# Patient Record
Sex: Female | Born: 1945 | Race: White | Hispanic: No | Smoking: Current every day smoker
Health system: Southern US, Community
[De-identification: ages and names within clinical notes are randomized; demographics above are authoritative.]

## PROBLEM LIST (undated history)

## (undated) DIAGNOSIS — F419 Anxiety disorder, unspecified: Secondary | ICD-10-CM

## (undated) DIAGNOSIS — F32A Depression, unspecified: Secondary | ICD-10-CM

## (undated) DIAGNOSIS — Z9889 Other specified postprocedural states: Secondary | ICD-10-CM

## (undated) DIAGNOSIS — F1010 Alcohol abuse, uncomplicated: Secondary | ICD-10-CM

## (undated) DIAGNOSIS — C801 Malignant (primary) neoplasm, unspecified: Secondary | ICD-10-CM

## (undated) DIAGNOSIS — J189 Pneumonia, unspecified organism: Secondary | ICD-10-CM

## (undated) DIAGNOSIS — F329 Major depressive disorder, single episode, unspecified: Secondary | ICD-10-CM

## (undated) DIAGNOSIS — R112 Nausea with vomiting, unspecified: Secondary | ICD-10-CM

## (undated) DIAGNOSIS — T7840XA Allergy, unspecified, initial encounter: Secondary | ICD-10-CM

## (undated) HISTORY — PX: BREAST SURGERY: SHX581

## (undated) HISTORY — DX: Allergy, unspecified, initial encounter: T78.40XA

---

## 1993-02-16 DIAGNOSIS — C801 Malignant (primary) neoplasm, unspecified: Secondary | ICD-10-CM

## 1993-02-16 HISTORY — DX: Malignant (primary) neoplasm, unspecified: C80.1

## 1993-02-16 HISTORY — PX: MASTECTOMY: SHX3

## 2005-02-11 ENCOUNTER — Encounter: Admission: RE | Admit: 2005-02-11 | Discharge: 2005-02-11 | Payer: Self-pay | Admitting: Family Medicine

## 2005-02-19 ENCOUNTER — Encounter: Admission: RE | Admit: 2005-02-19 | Discharge: 2005-02-19 | Payer: Self-pay | Admitting: Family Medicine

## 2005-03-18 ENCOUNTER — Ambulatory Visit: Payer: Self-pay | Admitting: Emergency Medicine

## 2005-09-14 ENCOUNTER — Ambulatory Visit: Payer: Self-pay | Admitting: Family Medicine

## 2005-10-08 ENCOUNTER — Ambulatory Visit: Payer: Self-pay | Admitting: Family Medicine

## 2005-10-26 ENCOUNTER — Ambulatory Visit: Payer: Self-pay | Admitting: Family Medicine

## 2005-11-12 ENCOUNTER — Ambulatory Visit: Payer: Self-pay | Admitting: Emergency Medicine

## 2007-01-17 DIAGNOSIS — J449 Chronic obstructive pulmonary disease, unspecified: Secondary | ICD-10-CM

## 2007-01-17 DIAGNOSIS — J4489 Other specified chronic obstructive pulmonary disease: Secondary | ICD-10-CM | POA: Insufficient documentation

## 2007-01-17 DIAGNOSIS — IMO0002 Reserved for concepts with insufficient information to code with codable children: Secondary | ICD-10-CM | POA: Insufficient documentation

## 2007-01-17 DIAGNOSIS — M19049 Primary osteoarthritis, unspecified hand: Secondary | ICD-10-CM | POA: Insufficient documentation

## 2007-01-17 DIAGNOSIS — M1710 Unilateral primary osteoarthritis, unspecified knee: Secondary | ICD-10-CM

## 2007-01-17 DIAGNOSIS — Z8659 Personal history of other mental and behavioral disorders: Secondary | ICD-10-CM

## 2007-01-17 DIAGNOSIS — F17200 Nicotine dependence, unspecified, uncomplicated: Secondary | ICD-10-CM | POA: Insufficient documentation

## 2007-01-17 DIAGNOSIS — F1021 Alcohol dependence, in remission: Secondary | ICD-10-CM

## 2010-03-09 ENCOUNTER — Encounter: Payer: Self-pay | Admitting: Emergency Medicine

## 2013-10-19 ENCOUNTER — Emergency Department (INDEPENDENT_AMBULATORY_CARE_PROVIDER_SITE_OTHER)
Admission: EM | Admit: 2013-10-19 | Discharge: 2013-10-19 | Disposition: A | Discharge status: Home / Self Care | Payer: Medicare Other | Source: Home / Self Care | Attending: Family Medicine | Admitting: Family Medicine

## 2013-10-19 ENCOUNTER — Encounter (HOSPITAL_COMMUNITY): Payer: Self-pay | Admitting: Emergency Medicine

## 2013-10-19 DIAGNOSIS — L255 Unspecified contact dermatitis due to plants, except food: Secondary | ICD-10-CM | POA: Diagnosis not present

## 2013-10-19 DIAGNOSIS — T148XXA Other injury of unspecified body region, initial encounter: Secondary | ICD-10-CM

## 2013-10-19 DIAGNOSIS — L03039 Cellulitis of unspecified toe: Secondary | ICD-10-CM

## 2013-10-19 DIAGNOSIS — X58XXXA Exposure to other specified factors, initial encounter: Secondary | ICD-10-CM

## 2013-10-19 DIAGNOSIS — L237 Allergic contact dermatitis due to plants, except food: Secondary | ICD-10-CM

## 2013-10-19 DIAGNOSIS — C4490 Unspecified malignant neoplasm of skin, unspecified: Secondary | ICD-10-CM | POA: Diagnosis not present

## 2013-10-19 DIAGNOSIS — L03032 Cellulitis of left toe: Secondary | ICD-10-CM

## 2013-10-19 DIAGNOSIS — L02619 Cutaneous abscess of unspecified foot: Secondary | ICD-10-CM

## 2013-10-19 HISTORY — DX: Malignant (primary) neoplasm, unspecified: C80.1

## 2013-10-19 MED ORDER — CEPHALEXIN 500 MG PO CAPS: 500.0000 mg | ORAL_CAPSULE | Freq: Two times a day (BID) | ORAL | Status: DC

## 2013-10-19 MED ORDER — PREDNISONE (PAK) 10 MG PO TABS: ORAL_TABLET | Freq: Every day | ORAL | Status: DC

## 2013-10-19 MED ORDER — SILVER SULFADIAZINE 1 % EX CREA
TOPICAL_CREAM | CUTANEOUS | Status: AC
  Filled 2013-10-19: qty 85

## 2013-10-19 NOTE — ED Notes (Signed)
Blister between 2 toes on left foot.  Patient reports noticing this blister 4 days ago.  Blister increasing in size, red outline to skin around blister, slight bruising under toe

## 2013-10-19 NOTE — ED Provider Notes (Addendum)
CSN: [id number]     Arrival date & time 10/19/13  1400 History   First MD Initiated Contact with Patient 10/19/13 1512     Chief Complaint  Patient presents with  . Blister   (Consider location/radiation/quality/duration/timing/severity/associated sxs/prior Treatment) HPI  Blister: poison ivy contact on L top of foot about 7 days ago. Initially itchy and blisters formed. Itchy. Getting worse. Blister getting bigger adn fluid more dark. Becoming tender. Soaking in baking soda w/o benefit. Non-radiating. Denies rash, CP, SOB.   Past Medical History  Diagnosis Date  . Cancer 1995    breast   Past Surgical History  Procedure Laterality Date  . Breast surgery     No family history on file. History  Substance Use Topics  . Smoking status: Current Every Day Smoker -- 1.00 packs/day  . Smokeless tobacco: Not on file  . Alcohol Use: No   OB History   Grav Para Term Preterm Abortions TAB SAB Ect Mult Living                 Review of Systems Per HPI with all other pertinent systems negative.    Allergies  Sulfa antibiotics  Home Medications   Prior to Admission medications   Medication Sig Start Date End Date Taking? Authorizing Provider  cephALEXin (KEFLEX) 500 MG capsule Take 1 capsule (500 mg total) by mouth 2 (two) times daily. 10/19/13   Waldemar Dickens, MD  predniSONE (STERAPRED UNI-PAK) 10 MG tablet Take by mouth daily. Take 5 tablets x 5 days, 4 x 3, 3 x 3, 2 x 3, 1 x 3. 10/19/13   Waldemar Dickens, MD   BP 115/75  Pulse 103  Temp(Src) 98.4 F (36.9 C) (Oral)  Resp 20  SpO2 99% Physical Exam  Constitutional: She is oriented to person, place, and time. She appears well-developed and well-nourished. No distress.  HENT:  Head: Normocephalic and atraumatic.  Eyes: EOM are normal. Pupils are equal, round, and reactive to light.  Neck: Normal range of motion.  Cardiovascular: Normal rate and intact distal pulses.   Pulmonary/Chest: Effort normal. No respiratory distress.   Musculoskeletal: Normal range of motion. She exhibits no edema.  Neurological: She is alert and oriented to person, place, and time.  Skin: She is not diaphoretic.  L distal foot w/ dorsal mild macular papular rash and 4th proximal toe w/ large purulent filled boil w/ surounding erythema and medial eccymoses (likely from adjoining toe trauma). R cheek w/ pearly appearing papule abut 0.5-1cm in size.    Psychiatric: She has a normal mood and affect. Her behavior is normal. Judgment and thought content normal.    ED Course  INCISION AND DRAINAGE Date/Time: 10/19/2013 3:35 PM Performed by: Marily Memos, DAVID J Authorized by: Marily Memos, DAVID J Consent: Verbal consent obtained. Consent given by: patient Patient identity confirmed: verbally with patient Type: cyst Body area: lower extremity Location details: left fourth toe Patient sedated: no Needle gauge: 18 Incision type: single straight Complexity: simple Drainage: purulent,  serosanguinous and  bloody Drainage amount: moderate Wound treatment: wound left open Comments: Silvadene ointment   (including critical care time) Labs Review Labs Reviewed  WOUND CULTURE    Imaging Review No results found.   MDM   1. Blister   2. Poison ivy   3. Cellulitis of toe of left foot   4. Skin cancer    Poison ivy blister which became infected.  Drained in office as above Start prednisone dose pack Start Kelfex Wound  care in structions given Precautions given and all questions answered Wound cx sent F/u Dermatology or PCP for likely basal cell ca removal  Linna Darner, MD Family Medicine 10/19/2013, 3:38 PM     Waldemar Dickens, MD 10/19/13 Powell, MD 10/19/13 1538

## 2013-10-19 NOTE — Discharge Instructions (Signed)
Please start the prednisone for the poison ivy Please start the antibiotics for the infection Please keep the dressing on until tomorrow then apply a clean bandage as needed Please come back if you do not improve or develop other symptoms Please follow up with your regular doctor or a dermatologist to have the skin cancer removed on your face Best of luck

## 2013-10-19 NOTE — ED Notes (Signed)
Silvadene, 2x2, kerlix applied to left foot blister.

## 2013-10-21 LAB — WOUND CULTURE
Culture: NO GROWTH
Gram Stain: NONE SEEN

## 2016-07-30 ENCOUNTER — Ambulatory Visit (INDEPENDENT_AMBULATORY_CARE_PROVIDER_SITE_OTHER): Payer: Medicare Other | Admitting: Internal Medicine

## 2016-07-30 ENCOUNTER — Encounter: Payer: Self-pay | Admitting: Internal Medicine

## 2016-07-30 ENCOUNTER — Other Ambulatory Visit: Payer: Self-pay | Admitting: Internal Medicine

## 2016-07-30 VITALS — BP 130/80 | HR 110 | Temp 97.9°F | Ht 63.0 in | Wt 114.2 lb

## 2016-07-30 DIAGNOSIS — F329 Major depressive disorder, single episode, unspecified: Secondary | ICD-10-CM | POA: Diagnosis not present

## 2016-07-30 DIAGNOSIS — N6310 Unspecified lump in the right breast, unspecified quadrant: Secondary | ICD-10-CM

## 2016-07-30 DIAGNOSIS — J301 Allergic rhinitis due to pollen: Secondary | ICD-10-CM

## 2016-07-30 DIAGNOSIS — Z853 Personal history of malignant neoplasm of breast: Secondary | ICD-10-CM

## 2016-07-30 DIAGNOSIS — F32A Depression, unspecified: Secondary | ICD-10-CM | POA: Insufficient documentation

## 2016-07-30 DIAGNOSIS — Z9012 Acquired absence of left breast and nipple: Secondary | ICD-10-CM

## 2016-07-30 DIAGNOSIS — F419 Anxiety disorder, unspecified: Secondary | ICD-10-CM

## 2016-07-30 NOTE — Patient Instructions (Signed)
Breast Scan A breast scan is an imaging test that is done to examine dense breast tissue. A breast scan is done using a radioactive material that produces images of the breast. A breast scan is used in people who have breast lesions that resulted from:  Rope-like, lumpy tissue (fibrocystic disease).  Solid, painless lumps (fibroadenoma).  Damaged fatty breast tissue (fat necrosis).  A breast scan may also be done to find out the best treatment for people who have breast cancer. Tell a health care provider about:  Any allergies you have.  All medicines you are taking, including vitamins, herbs, eye drops, creams, and over-the-counter medicines.  Any problems you or family members have had with anesthetic medicines.  Any blood disorders you have.  Any surgeries you have had.  Any medical conditions you have.  Whether you are pregnant or may be pregnant, if this applies.  Whether you are breastfeeding, if this applies. What are the risks? Generally, this is a safe procedure. However, problems may occur, including:  Slight discomfort from injection of a radioactive substance.  Allergic reaction to a contrast or radioactive substance used during the procedure.  What happens before the procedure?  Ask your health care provider about changing or stopping your regular medicines. This is especially important if you are taking diabetes medicines or blood thinners. What happens during the procedure?  You will be asked to remove all jewelry and clothing from the waist up.  An IV tube will be inserted into one of your veins.  You will be asked to lie face-down on a table. The breast that will be scanned will be placed through an opening in the table. You may also be asked to get into different positions during the scan.  The radioactive agent will be injected into the IV tube. You may have a slight metallic taste after the injection.  A scanner will be placed over the breast to  record the radiation, which produces an image of the breast.  The procedure may be repeated on the other breast.  When the scan is complete, the IV tube will be removed. The procedure may vary among health care providers and hospitals. What happens after the procedure?  You will be asked to get up slowly. This helps you avoid light-headedness after lying flat during the procedure.  Drink enough fluid to keep your urine clear or pale yellow. This helps to wash (flush) the remaining radioactive agent out of your body.  It is up to you to get the results of your procedure. Ask your health care provider, or the department that is doing the procedure, when your results will be ready. Summary  A breast scan is an imaging test that looks at breast lesions. A breast scan may also be done to find out the best treatment for people who have breast cancer.  A radioactive substance is injected through an IV tube, and pictures are taken of the breasts.  After the procedure, drink enough fluid to keep your urine clear or pale yellow. This helps to wash (flush) the remaining radioactive agent out of your body. This information is not intended to replace advice given to you by your health care provider. Make sure you discuss any questions you have with your health care provider. Document Released: 02/28/2004 Document Revised: 10/03/2015 Document Reviewed: 10/01/2015 Elsevier Interactive Patient Education  2017 Elsevier Inc.  

## 2016-07-30 NOTE — Progress Notes (Signed)
HPI  Pt presents to the clinic today to establish care and for management of the conditions listed below. She has not had a PCP in many years, Dr. Karene Fry prior.  Seasonal Allergies: Worse in the fall and spring. She does not take anything OTC for this.  History of Left Breast Cancer: s/p mastectomy. She reports she has not had a mammogram recently. She noticed a lump in the right side of her breast. The lump has gotten bigger in size. It is tender to touch but not red or warm. She denies drainage from the nipple.   Depression: This has occurred periodically through her life. She has been treated with antidepressants in the past. She has had group therapy sessions in the past with good relief. She does not feel like she needs treatment for this time.  Flu: never Tetanus: > 10 years ago Pneumovax: > 5 years ago Prevnar: never Zostavax: never Shingrix: never Mammogram: > 5 years ago Bone Density: unsure Colon Screening: > 10 years ago Vision Screening: as needed Dentist: as needed  Past Medical History:  Diagnosis Date  . Allergy   . Cancer [facility]) 1995   breast left    No current outpatient prescriptions on file.   No current facility-administered medications for this visit.     Allergies  Allergen Reactions  . Sulfa Antibiotics     Family History  Problem Relation Age of Onset  . Depression Mother   . Hyperlipidemia Father   . Heart disease Maternal Grandfather     Social History   Social History  . Marital status: Divorced    Spouse name: N/A  . Number of children: N/A  . Years of education: N/A   Occupational History  . Not on file.   Social History Main Topics  . Smoking status: Current Every Day Smoker    Packs/day: 1.00  . Smokeless tobacco: Never Used  . Alcohol use No  . Drug use: No  . Sexual activity: Not on file   Other Topics Concern  . Not on file   Social History Narrative  . No narrative on file    ROS:  Constitutional: Denies fever,  malaise, fatigue, headache or abrupt weight changes.  Skin: Pt reports lump of right breast. Denies redness, rashes, lesions or ulcercations.    No other specific complaints in a complete review of systems (except as listed in HPI above).  PE:  BP 130/80   Pulse (!) 110   Temp 97.9 F (36.6 C) (Oral)   Ht 5\' 3"  (1.6 m)   Wt 114 lb 4 oz (51.8 kg)   SpO2 98%   BMI 20.24 kg/m   Wt Readings from Last 3 Encounters:  07/30/16 114 lb 4 oz (51.8 kg)    General: Appears her stated age, in NAD. Skin: 2 cm x 2 cm mass noted at 7 oclock right breast. No nipple discharge noted. Cardiovascular: Normal rate and rhythm. Pulmonary/Chest: Normal effort and positive vesicular breath sounds. No respiratory distress. No wheezes, rales or ronchi noted.  Neurological: Alert and oriented.  Psychiatric: Mood and affect normal. Behavior is normal. Judgment and thought content normal.    Assessment and Plan:  Mass of Right Breast:  Diagnostic mammogram and ultrasound ordered  History of Left Breast Cancer:  S/p mastectomy  Seasonal Allergies:  Currently not an issue Will monitor  History of Depression:  In remission Will monitor  Make an appt for your annual exam Webb Silversmith, NP

## 2016-07-31 ENCOUNTER — Ambulatory Visit
Admission: RE | Admit: 2016-07-31 | Discharge: 2016-07-31 | Disposition: A | Discharge status: Home / Self Care | Payer: Medicare Other | Source: Ambulatory Visit | Attending: Internal Medicine | Admitting: Internal Medicine

## 2016-07-31 ENCOUNTER — Other Ambulatory Visit: Payer: Self-pay | Admitting: Internal Medicine

## 2016-07-31 DIAGNOSIS — N6310 Unspecified lump in the right breast, unspecified quadrant: Secondary | ICD-10-CM

## 2016-07-31 DIAGNOSIS — R922 Inconclusive mammogram: Secondary | ICD-10-CM | POA: Diagnosis not present

## 2016-07-31 DIAGNOSIS — Z9012 Acquired absence of left breast and nipple: Secondary | ICD-10-CM

## 2016-07-31 DIAGNOSIS — N6489 Other specified disorders of breast: Secondary | ICD-10-CM | POA: Diagnosis not present

## 2016-07-31 DIAGNOSIS — Z853 Personal history of malignant neoplasm of breast: Secondary | ICD-10-CM

## 2016-07-31 DIAGNOSIS — R928 Other abnormal and inconclusive findings on diagnostic imaging of breast: Secondary | ICD-10-CM | POA: Diagnosis not present

## 2016-08-04 ENCOUNTER — Other Ambulatory Visit: Payer: Medicare Other

## 2016-08-05 ENCOUNTER — Ambulatory Visit
Admission: RE | Admit: 2016-08-05 | Discharge: 2016-08-05 | Disposition: A | Discharge status: Home / Self Care | Payer: Medicare Other | Source: Ambulatory Visit | Attending: Internal Medicine | Admitting: Internal Medicine

## 2016-08-05 ENCOUNTER — Other Ambulatory Visit: Payer: Self-pay | Admitting: Internal Medicine

## 2016-08-05 DIAGNOSIS — N6313 Unspecified lump in the right breast, lower outer quadrant: Secondary | ICD-10-CM | POA: Diagnosis not present

## 2016-08-05 DIAGNOSIS — N6310 Unspecified lump in the right breast, unspecified quadrant: Secondary | ICD-10-CM

## 2016-08-05 DIAGNOSIS — Z9012 Acquired absence of left breast and nipple: Secondary | ICD-10-CM

## 2016-08-05 DIAGNOSIS — R921 Mammographic calcification found on diagnostic imaging of breast: Secondary | ICD-10-CM | POA: Diagnosis not present

## 2016-08-05 DIAGNOSIS — Z853 Personal history of malignant neoplasm of breast: Secondary | ICD-10-CM

## 2016-08-05 DIAGNOSIS — C50511 Malignant neoplasm of lower-outer quadrant of right female breast: Secondary | ICD-10-CM | POA: Diagnosis not present

## 2016-08-05 DIAGNOSIS — D241 Benign neoplasm of right breast: Secondary | ICD-10-CM | POA: Diagnosis not present

## 2016-08-18 ENCOUNTER — Other Ambulatory Visit: Payer: Self-pay | Admitting: Surgery

## 2016-08-18 DIAGNOSIS — C50911 Malignant neoplasm of unspecified site of right female breast: Secondary | ICD-10-CM | POA: Diagnosis not present

## 2016-08-18 DIAGNOSIS — Z853 Personal history of malignant neoplasm of breast: Secondary | ICD-10-CM | POA: Diagnosis not present

## 2016-08-18 NOTE — Pre-Procedure Instructions (Addendum)
Veronica Hartman  08/18/2016      [id number] [place], [place] - [address] [address] Phone: [phone] Fax: [phone]  [id number] - [place], [address] [place] [address] Phone: [phone] Fax: [phone]    Your procedure is scheduled on   Monday  08/24/16  Report to [facility] at 900 A.M.  Call this number if you have problems the morning of surgery:  [phone]   Remember:  Do not eat food or drink liquids after midnight.  EXCEPT DRINK BOOST BREEZE  BY 700 AM DAY OF SURGERY.  Take these medicines the morning of surgery with A SIP OF WATER -   Do not wear jewelry, make-up or nail polish.  Do not wear lotions, powders, or perfumes, or deoderant.  Do not shave 48 hours prior to surgery.  Men may shave face and neck.  Do not bring valuables to the hospital.  [facility] is not responsible for any belongings or valuables.  Contacts, dentures or bridgework may not be worn into surgery.  Leave your suitcase in the car.  After surgery it may be brought to your room.  For patients admitted to the hospital, discharge time will be determined by your treatment team.  Patients discharged the day of surgery will not be allowed to drive home.   Name and phone number of your driver:    Special instructions:  [facility] - Preparing for Surgery  Before surgery, you can play an important role.  Because skin is not sterile, your skin needs to be as free of germs as possible.  You can reduce the number of germs on you skin by washing with CHG (chlorahexidine gluconate) soap before surgery.  CHG is an antiseptic cleaner which kills germs and bonds with the skin to continue killing germs even after washing.  Please DO NOT use if you have an allergy to CHG or antibacterial soaps.  If your skin becomes reddened/irritated stop using the CHG and  inform your nurse when you arrive at Short Stay.  Do not shave (including legs and underarms) for at least 48 hours prior to the first CHG shower.  You may shave your face.  Please follow these instructions carefully:   1.  Shower with CHG Soap the night before surgery and the                                morning of Surgery.  2.  If you choose to wash your hair, wash your hair first as usual with your       normal shampoo.  3.  After you shampoo, rinse your hair and body thoroughly to remove the                      Shampoo.  4.  Use CHG as you would any other liquid soap.  You can apply chg directly       to the skin and wash gently with scrungie or a clean washcloth.  5.  Apply the CHG Soap to your body ONLY FROM THE NECK DOWN.        Do not use on open wounds or open sores.  Avoid contact with your eyes,       ears, mouth and genitals (private parts).  Wash genitals (private parts)       with your normal soap.  6.  Wash thoroughly, paying special attention to the area where your surgery        will be performed.  7.  Thoroughly rinse your body with warm water from the neck down.  8.  DO NOT shower/wash with your normal soap after using and rinsing off       the CHG Soap.  9.  Pat yourself dry with a clean towel.            10.  Wear clean pajamas.            11.  Place clean sheets on your bed the night of your first shower and do not        sleep with pets.  Day of Surgery  Do not apply any lotions/deoderants the morning of surgery.  Please wear clean clothes to the hospital/surgery center.    Please read over the following fact sheets that you were given. Coughing and Deep Breathing and Surgical Site Infection Prevention

## 2016-08-20 ENCOUNTER — Encounter (HOSPITAL_COMMUNITY): Payer: Self-pay

## 2016-08-20 ENCOUNTER — Encounter (HOSPITAL_COMMUNITY)
Admission: RE | Admit: 2016-08-20 | Discharge: 2016-08-20 | Disposition: A | Discharge status: Home / Self Care | Payer: Medicare Other | Source: Ambulatory Visit | Attending: Surgery | Admitting: Surgery

## 2016-08-20 DIAGNOSIS — C50911 Malignant neoplasm of unspecified site of right female breast: Secondary | ICD-10-CM | POA: Insufficient documentation

## 2016-08-20 DIAGNOSIS — Z01812 Encounter for preprocedural laboratory examination: Secondary | ICD-10-CM | POA: Insufficient documentation

## 2016-08-20 HISTORY — DX: Other specified postprocedural states: Z98.890

## 2016-08-20 HISTORY — DX: Pneumonia, unspecified organism: J18.9

## 2016-08-20 HISTORY — DX: Alcohol abuse, uncomplicated: F10.10

## 2016-08-20 HISTORY — DX: Anxiety disorder, unspecified: F41.9

## 2016-08-20 HISTORY — DX: Depression, unspecified: F32.A

## 2016-08-20 HISTORY — DX: Other specified postprocedural states: R11.2

## 2016-08-20 HISTORY — DX: Major depressive disorder, single episode, unspecified: F32.9

## 2016-08-20 LAB — COMPREHENSIVE METABOLIC PANEL
ALBUMIN: 4.3 g/dL (ref 3.5–5.0)
ALT: 14 U/L (ref 14–54)
ANION GAP: 9 (ref 5–15)
AST: 25 U/L (ref 15–41)
Alkaline Phosphatase: 87 U/L (ref 38–126)
BUN: 12 mg/dL (ref 6–20)
CHLORIDE: 106 mmol/L (ref 101–111)
CO2: 24 mmol/L (ref 22–32)
Calcium: 9.5 mg/dL (ref 8.9–10.3)
Creatinine, Ser: 1.17 mg/dL — ABNORMAL HIGH (ref 0.44–1.00)
GFR calc Af Amer: 53 mL/min — ABNORMAL LOW (ref >60)
GFR calc non Af Amer: 46 mL/min — ABNORMAL LOW (ref >60)
GLUCOSE: 90 mg/dL (ref 65–99)
POTASSIUM: 4.4 mmol/L (ref 3.5–5.1)
SODIUM: 139 mmol/L (ref 135–145)
TOTAL PROTEIN: 7 g/dL (ref 6.5–8.1)
Total Bilirubin: 0.7 mg/dL (ref 0.3–1.2)

## 2016-08-20 LAB — CBC
HCT: 45.7 % (ref 36.0–46.0)
Hemoglobin: 14.8 g/dL (ref 12.0–15.0)
MCH: 31 pg (ref 26.0–34.0)
MCHC: 32.4 g/dL (ref 30.0–36.0)
MCV: 95.6 fL (ref 78.0–100.0)
PLATELETS: 260 10*3/uL (ref 150–400)
RBC: 4.78 MIL/uL (ref 3.87–5.11)
RDW: 14.3 % (ref 11.5–15.5)
WBC: 10 10*3/uL (ref 4.0–10.5)

## 2016-08-20 NOTE — Progress Notes (Signed)
PCP - Webb Silversmith Cardiologist - patient denies  Chest x-ray - n/a EKG - n/a Stress Test - patient denies ECHO - patient denies Cardiac Cath - patient denies  Sleep Study - patient denies   Patient denies shortness of breath, fever, cough and chest pain at PAT appointment   Patient verbalized understanding of instructions that were given to them at the PAT appointment. Patient was also instructed that they will need to review over the PAT instructions again at home before surgery.

## 2016-08-23 NOTE — H&P (Signed)
Veronica Hartman  Location: [facility] Surgery Patient #: [id number] DOB: [birth year] Single / Language: Cleophus Molt / Race: White Female   History of Present Illness   The patient is a 71 year old female who presents with a complaint of breast cancer.  The PCP is Loralie Champagne, NP  The patient was referred by Dr. Theodosia Blender  She comes by herself.  The patient has not taken much care of herself and the last 15 or 20 years. She takes care of 6 rescue dogs, and this seems to be the center of her life. She noticed a mass in her right breast about 3 months ago, which prompted her seeking a mammogram. She has a history of a left mastectomy by Dr. Kathreen Cornfield in the 1990s. She says she first had a lumpectomy and then Dr. Mendel Ryder went back and did the mastectomy. I cannot find that pathology in Epic. She had no additional treatment such as radiation or antihormone medication.  Ms. Cothern was presented at [facility] on 08/12/2016. It sounds like she had left breast cancer treated with mastectomy about 20 years ago. She has not had a mammogram in 15 years.  She has a mass at the 7:00 position in the right breast with probable skin involvement.  She'll probably need a mastectomy (particularly if she will not follow up). MRI only for breast conservation. Probable oncotype. Question staging scans if skin involved.  Mammograms: at the [facility] on 07/31/2016 - 1. Highly suspicious 2.1 cm mass involving the lower outer quadrant of the right breast with associated microcalcifications and dermal skin involvement. 2. Indeterminate 9 mm group of coarse heterogeneous calcifications in the upper outer quadrant of the right breast. These calcifications are approximately 5 cm superior to the suspicious mass. 3. No pathologic right axillary lymphadenopathy Biopsy: on 08/05/2016 ([id number]) - IDC, grade 2/3, ER - 100%, PR - 20%, Ki67 - 15%,  Her2Neu neg. Family history of breast or ovarian cancer: On hormone therapy:  I discussed the options for breast cancer treatment with the patient. I discussed that treatment usually includes medical oncology and radiation oncology. I discussed the surgical options of lumpectomy vs. mastectomy. If mastectomy, there is the possibility of reconstruction. I discussed the options of lymph node biopsy. The treatment plan depends on the pathologic staging of the tumor and the patient's personal wishes. The risks of surgery include, but are not limited to, bleeding, infection, the need for further surgery, and nerve injury. The patient has been given literature on the treatment of breast cancer.  Past Medical History: 1. Prior left breast cancer - 1998 Dr. Tyna Jaksch - she tried a lumpctomy first. 2. HIstory of depression 3. Smokes about one pack a day - she rolls her own 4. Colonoscopy about the age she turned 1.  Social History: Divorced Lives by self  Has 6 dogs - which she is worried about  She has 2 sons: ages 65 and 26, one in [place] and one in [place]   Past Surgical History Malachy Moan, [place]; 08/18/2016 8:50 AM) Breast Biopsy  Bilateral. Mastectomy  Left.  Diagnostic Studies History Malachy Moan, [place]; 08/18/2016 8:50 AM) Colonoscopy  >10 years ago Mammogram  within last year Pap Smear  >5 years ago  Allergies Malachy Moan, RMA; 08/18/2016 8:52 AM) Ignacia Bayley Antibiotics   Medication History Malachy Moan, RMA; 08/18/2016 8:52 AM) No Current Medications Medications Reconciled  Social History Malachy Moan, [place]; 08/18/2016 8:50 AM) Alcohol use  Occasional alcohol use. Caffeine  use  Coffee. No drug use  Tobacco use  Current every day smoker.  Family History Malachy Moan, [place]; 08/18/2016 8:50 AM) Depression  Mother. Hypertension  Father.  Pregnancy / Birth History Malachy Moan, [place]; 08/18/2016 8:50 AM) Age at  menarche  32 years. Age of menopause  38-50 Contraceptive History  Oral contraceptives. Gravida  2 Length (months) of breastfeeding  7-12 Maternal age  22-25 Para  2  Other Problems Malachy Moan, [place]; 08/18/2016 8:50 AM) Breast Cancer  Lump In Breast     Review of Systems Malachy Moan RMA; 08/18/2016 8:50 AM) General Not Present- Appetite Loss, Chills, Fatigue, Fever, Night Sweats, Weight Gain and Weight Loss. Skin Not Present- Change in Wart/Mole, Dryness, Hives, Jaundice, New Lesions, Non-Healing Wounds, Rash and Ulcer. HEENT Present- Seasonal Allergies and Wears glasses/contact lenses. Not Present- Earache, Hearing Loss, Hoarseness, Nose Bleed, Oral Ulcers, Ringing in the Ears, Sinus Pain, Sore Throat, Visual Disturbances and Yellow Eyes. Respiratory Not Present- Bloody sputum, Chronic Cough, Difficulty Breathing, Snoring and Wheezing. Breast Present- Breast Mass. Not Present- Breast Pain, Nipple Discharge and Skin Changes. Cardiovascular Not Present- Chest Pain, Difficulty Breathing Lying Down, Leg Cramps, Palpitations, Rapid Heart Rate, Shortness of Breath and Swelling of Extremities. Gastrointestinal Not Present- Abdominal Pain, Bloating, Bloody Stool, Change in Bowel Habits, Chronic diarrhea, Constipation, Difficulty Swallowing, Excessive gas, Gets full quickly at meals, Hemorrhoids, Indigestion, Nausea, Rectal Pain and Vomiting. Female Genitourinary Not Present- Frequency, Nocturia, Painful Urination, Pelvic Pain and Urgency. Musculoskeletal Not Present- Back Pain, Joint Pain, Joint Stiffness, Muscle Pain, Muscle Weakness and Swelling of Extremities. Neurological Not Present- Decreased Memory, Fainting, Headaches, Numbness, Seizures, Tingling, Tremor, Trouble walking and Weakness. Psychiatric Not Present- Anxiety, Bipolar, Change in Sleep Pattern, Depression, Fearful and Frequent crying. Endocrine Not Present- Cold Intolerance, Excessive Hunger, Hair Changes, Heat  Intolerance, Hot flashes and New Diabetes. Hematology Not Present- Blood Thinners, Easy Bruising, Excessive bleeding, Gland problems, HIV and Persistent Infections.  Vitals Malachy Moan RMA; 08/18/2016 8:52 AM) 08/18/2016 8:52 AM Weight: 115.8 lb Height: 63in Body Surface Area: 1.53 m Body Mass Index: 20.51 kg/m  Temp.: 98.34F  Pulse: 106 (Regular)  BP: 170/80 (Sitting, Left Arm, Standard)   Physical Exam  General: Thin WFalert and generally healthy appearing. Skin: Inspection and palpation of the skin unremarkable.  Eyes: Conjunctivae white, pupils equal. Face, ears, nose, mouth, and throat: Face - normal. Normal ears and nose.  She has terrible teeth  Neck: Supple. No mass. Trachea midline. No thyroid mass.  Lymph Nodes: No supraclavicular or cervical adenopathy. No axillary adenopathy.  Lungs: Normal respiratory effort. Clear to auscultation and symmetric breath sounds. Cardiovascular: Regular rate and rythm. Normal auscultation of the heart. No murmur or rub.  Breasts: Right - Bruise at 7 o'clock and 11 o'clock. She has a 2 cm mass at 7 o'clock - it is not fixed. I do not see skin changes over this. Left - absent. No mass or nodule. Her incision looks good.  Abdomen: Soft. No mass. Liver and spleen not palpable. No tenderness. No hernia. Normal bowel sounds.   Musculoskeletal/extremities: Normal gait. Good strength and ROM in upper and lower extremities.   Neurologic: Grossly intact to motor and sensory function.   Psychiatric: Has normal mood and affect. Judgement and insight appear normal.  Assessment & Plan  1.  BREAST CANCER, STAGE 2, RIGHT (C50.911)  Story: Right breast biopsy on 08/05/2016 ([id number]) - IDC, grade 2/3, ER - 100%, PR - 20%, Ki67 - 15%, Her2Neu neg.  Plan:  1) Right mastectomy with right axillary SLNBx   2) PT   3) Med onc consult   4) Try to quit smoking  2.  HISTORY OF LEFT  BREAST CANCER (Z85.3) 3.  Smokes about 1 ppd 4.  Very poor dentition  Alphonsa Overall, MD, [facility] Surgery Pager: [phone] Office phone:  [phone]

## 2016-08-24 ENCOUNTER — Encounter (HOSPITAL_COMMUNITY): Payer: Self-pay | Admitting: General Practice

## 2016-08-24 ENCOUNTER — Ambulatory Visit (HOSPITAL_COMMUNITY): Payer: Medicare Other

## 2016-08-24 ENCOUNTER — Ambulatory Visit (HOSPITAL_COMMUNITY): Payer: Medicare Other | Admitting: Certified Registered"

## 2016-08-24 ENCOUNTER — Observation Stay (HOSPITAL_COMMUNITY)
Admission: RE | Admit: 2016-08-24 | Discharge: 2016-08-25 | Disposition: A | Discharge status: Home / Self Care | Payer: Medicare Other | Source: Ambulatory Visit | Attending: Surgery | Admitting: Surgery

## 2016-08-24 ENCOUNTER — Encounter (HOSPITAL_COMMUNITY)
Admission: RE | Disposition: A | Discharge status: Home / Self Care | Payer: Self-pay | Source: Ambulatory Visit | Attending: Surgery

## 2016-08-24 ENCOUNTER — Ambulatory Visit (HOSPITAL_COMMUNITY)
Admission: RE | Admit: 2016-08-24 | Discharge: 2016-08-24 | Disposition: A | Discharge status: Home / Self Care | Payer: Medicare Other | Source: Ambulatory Visit | Attending: Surgery | Admitting: Surgery

## 2016-08-24 DIAGNOSIS — F17210 Nicotine dependence, cigarettes, uncomplicated: Secondary | ICD-10-CM | POA: Diagnosis not present

## 2016-08-24 DIAGNOSIS — F329 Major depressive disorder, single episode, unspecified: Secondary | ICD-10-CM | POA: Insufficient documentation

## 2016-08-24 DIAGNOSIS — C50911 Malignant neoplasm of unspecified site of right female breast: Secondary | ICD-10-CM | POA: Diagnosis not present

## 2016-08-24 DIAGNOSIS — C779 Secondary and unspecified malignant neoplasm of lymph node, unspecified: Secondary | ICD-10-CM

## 2016-08-24 DIAGNOSIS — C50511 Malignant neoplasm of lower-outer quadrant of right female breast: Secondary | ICD-10-CM | POA: Diagnosis not present

## 2016-08-24 DIAGNOSIS — C50411 Malignant neoplasm of upper-outer quadrant of right female breast: Secondary | ICD-10-CM | POA: Diagnosis not present

## 2016-08-24 DIAGNOSIS — J302 Other seasonal allergic rhinitis: Secondary | ICD-10-CM | POA: Diagnosis not present

## 2016-08-24 DIAGNOSIS — G8918 Other acute postprocedural pain: Secondary | ICD-10-CM | POA: Diagnosis not present

## 2016-08-24 HISTORY — PX: MASTECTOMY W/ SENTINEL NODE BIOPSY: SHX2001

## 2016-08-24 HISTORY — PX: MASTECTOMY WITH AXILLARY LYMPH NODE DISSECTION: SHX5661

## 2016-08-24 SURGERY — MASTECTOMY WITH AXILLARY LYMPH NODE DISSECTION
Anesthesia: General | Site: Breast | Laterality: Right

## 2016-08-24 MED ORDER — PHENYLEPHRINE 40 MCG/ML (10ML) SYRINGE FOR IV PUSH (FOR BLOOD PRESSURE SUPPORT)
PREFILLED_SYRINGE | INTRAVENOUS | Status: AC
  Filled 2016-08-24: qty 10

## 2016-08-24 MED ORDER — MIDAZOLAM HCL 5 MG/5ML IJ SOLN
INTRAMUSCULAR | Status: DC | PRN
  Administered 2016-08-24: 2 mg via INTRAVENOUS

## 2016-08-24 MED ORDER — FENTANYL CITRATE (PF) 250 MCG/5ML IJ SOLN
INTRAMUSCULAR | Status: AC
  Filled 2016-08-24: qty 5

## 2016-08-24 MED ORDER — KCL IN DEXTROSE-NACL 20-5-0.45 MEQ/L-%-% IV SOLN
INTRAVENOUS | Status: DC
  Administered 2016-08-24 – 2016-08-25 (×2): via INTRAVENOUS
  Filled 2016-08-24 (×2): qty 1000

## 2016-08-24 MED ORDER — ACETAMINOPHEN 325 MG PO TABS
650.0000 mg | ORAL_TABLET | Freq: Four times a day (QID) | ORAL | Status: DC
  Administered 2016-08-25 (×2): 650 mg via ORAL
  Filled 2016-08-24 (×3): qty 2

## 2016-08-24 MED ORDER — HYDROMORPHONE HCL 1 MG/ML IJ SOLN: 0.2500 mg | INTRAMUSCULAR | Status: DC | PRN

## 2016-08-24 MED ORDER — EPHEDRINE 5 MG/ML INJ
INTRAVENOUS | Status: AC
  Filled 2016-08-24: qty 10

## 2016-08-24 MED ORDER — LIDOCAINE HCL (CARDIAC) 20 MG/ML IV SOLN
INTRAVENOUS | Status: AC
  Filled 2016-08-24: qty 5

## 2016-08-24 MED ORDER — LACTATED RINGERS IV SOLN
INTRAVENOUS | Status: DC
  Administered 2016-08-24 (×3): via INTRAVENOUS

## 2016-08-24 MED ORDER — PHENYLEPHRINE HCL 10 MG/ML IJ SOLN
INTRAVENOUS | Status: DC | PRN
  Administered 2016-08-24: 30 ug/min via INTRAVENOUS

## 2016-08-24 MED ORDER — CHLORHEXIDINE GLUCONATE CLOTH 2 % EX PADS: 6.0000 | MEDICATED_PAD | Freq: Once | CUTANEOUS | Status: DC

## 2016-08-24 MED ORDER — LIDOCAINE HCL (CARDIAC) 20 MG/ML IV SOLN
INTRAVENOUS | Status: DC | PRN
  Administered 2016-08-24: 20 mg via INTRAVENOUS

## 2016-08-24 MED ORDER — GABAPENTIN 300 MG PO CAPS
300.0000 mg | ORAL_CAPSULE | ORAL | Status: AC
  Administered 2016-08-24: 300 mg via ORAL

## 2016-08-24 MED ORDER — PHENYLEPHRINE 40 MCG/ML (10ML) SYRINGE FOR IV PUSH (FOR BLOOD PRESSURE SUPPORT)
PREFILLED_SYRINGE | INTRAVENOUS | Status: DC | PRN
  Administered 2016-08-24 (×4): 80 ug via INTRAVENOUS

## 2016-08-24 MED ORDER — CEFAZOLIN SODIUM-DEXTROSE 2-4 GM/100ML-% IV SOLN
INTRAVENOUS | Status: AC
  Filled 2016-08-24: qty 100

## 2016-08-24 MED ORDER — ONDANSETRON HCL 4 MG/2ML IJ SOLN: 4.0000 mg | Freq: Four times a day (QID) | INTRAMUSCULAR | Status: DC | PRN

## 2016-08-24 MED ORDER — PROPOFOL 10 MG/ML IV BOLUS
INTRAVENOUS | Status: DC | PRN
  Administered 2016-08-24: 30 mg via INTRAVENOUS
  Administered 2016-08-24: 100 mg via INTRAVENOUS

## 2016-08-24 MED ORDER — EPHEDRINE SULFATE-NACL 50-0.9 MG/10ML-% IV SOSY
PREFILLED_SYRINGE | INTRAVENOUS | Status: DC | PRN
  Administered 2016-08-24 (×2): 5 mg via INTRAVENOUS
  Administered 2016-08-24: 10 mg via INTRAVENOUS
  Administered 2016-08-24 (×2): 5 mg via INTRAVENOUS

## 2016-08-24 MED ORDER — SODIUM CHLORIDE 0.9 % IJ SOLN
INTRAMUSCULAR | Status: AC
  Filled 2016-08-24: qty 10

## 2016-08-24 MED ORDER — TECHNETIUM TC 99M SULFUR COLLOID FILTERED
1.0000 | Freq: Once | INTRAVENOUS | Status: AC | PRN
  Administered 2016-08-24: 1 via INTRADERMAL

## 2016-08-24 MED ORDER — 0.9 % SODIUM CHLORIDE (POUR BTL) OPTIME
TOPICAL | Status: DC | PRN
  Administered 2016-08-24 (×2): 1000 mL

## 2016-08-24 MED ORDER — ROCURONIUM BROMIDE 50 MG/5ML IV SOLN
INTRAVENOUS | Status: AC
  Filled 2016-08-24: qty 1

## 2016-08-24 MED ORDER — SODIUM CHLORIDE 0.9 % IJ SOLN
INTRAVENOUS | Status: DC | PRN
  Administered 2016-08-24: 1 mL via INTRAMUSCULAR

## 2016-08-24 MED ORDER — ONDANSETRON HCL 4 MG/2ML IJ SOLN
INTRAMUSCULAR | Status: DC | PRN
  Administered 2016-08-24: 4 mg via INTRAVENOUS

## 2016-08-24 MED ORDER — SUCCINYLCHOLINE CHLORIDE 200 MG/10ML IV SOSY
PREFILLED_SYRINGE | INTRAVENOUS | Status: AC
  Filled 2016-08-24: qty 10

## 2016-08-24 MED ORDER — ACETAMINOPHEN 500 MG PO TABS
ORAL_TABLET | ORAL | Status: AC
  Filled 2016-08-24: qty 2

## 2016-08-24 MED ORDER — IBUPROFEN 600 MG PO TABS: 600.0000 mg | ORAL_TABLET | Freq: Four times a day (QID) | ORAL | Status: DC | PRN

## 2016-08-24 MED ORDER — DEXAMETHASONE SODIUM PHOSPHATE 4 MG/ML IJ SOLN
INTRAMUSCULAR | Status: DC | PRN
  Administered 2016-08-24: 8 mg via INTRAVENOUS

## 2016-08-24 MED ORDER — ONDANSETRON 4 MG PO TBDP
4.0000 mg | ORAL_TABLET | Freq: Four times a day (QID) | ORAL | Status: DC | PRN
Start: 2016-08-24 — End: 2016-08-25

## 2016-08-24 MED ORDER — PNEUMOCOCCAL VAC POLYVALENT 25 MCG/0.5ML IJ INJ: 0.5000 mL | INJECTION | INTRAMUSCULAR | Status: DC

## 2016-08-24 MED ORDER — DEXAMETHASONE SODIUM PHOSPHATE 10 MG/ML IJ SOLN
INTRAMUSCULAR | Status: AC
  Filled 2016-08-24: qty 1

## 2016-08-24 MED ORDER — CEFAZOLIN SODIUM-DEXTROSE 2-4 GM/100ML-% IV SOLN
2.0000 g | INTRAVENOUS | Status: AC
  Administered 2016-08-24: 2 g via INTRAVENOUS

## 2016-08-24 MED ORDER — ONDANSETRON HCL 4 MG/2ML IJ SOLN
INTRAMUSCULAR | Status: AC
  Filled 2016-08-24: qty 2

## 2016-08-24 MED ORDER — METHYLENE BLUE 0.5 % INJ SOLN
INTRAVENOUS | Status: AC
  Filled 2016-08-24: qty 10

## 2016-08-24 MED ORDER — MIDAZOLAM HCL 2 MG/2ML IJ SOLN
INTRAMUSCULAR | Status: DC
Start: 2016-08-24 — End: 2016-08-24
  Filled 2016-08-24: qty 2

## 2016-08-24 MED ORDER — MORPHINE SULFATE (PF) 4 MG/ML IV SOLN: 1.0000 mg | INTRAVENOUS | Status: DC | PRN

## 2016-08-24 MED ORDER — HEPARIN SODIUM (PORCINE) 5000 UNIT/ML IJ SOLN
5000.0000 [IU] | Freq: Three times a day (TID) | INTRAMUSCULAR | Status: DC
  Administered 2016-08-25: 5000 [IU] via SUBCUTANEOUS
  Filled 2016-08-24: qty 1

## 2016-08-24 MED ORDER — ACETAMINOPHEN 500 MG PO TABS
1000.0000 mg | ORAL_TABLET | ORAL | Status: AC
  Administered 2016-08-24: 1000 mg via ORAL

## 2016-08-24 MED ORDER — PROMETHAZINE HCL 25 MG/ML IJ SOLN: 6.2500 mg | INTRAMUSCULAR | Status: DC | PRN

## 2016-08-24 MED ORDER — MIDAZOLAM HCL 2 MG/2ML IJ SOLN
INTRAMUSCULAR | Status: AC
  Filled 2016-08-24: qty 2

## 2016-08-24 MED ORDER — FENTANYL CITRATE (PF) 100 MCG/2ML IJ SOLN
INTRAMUSCULAR | Status: DC | PRN
  Administered 2016-08-24 (×3): 50 ug via INTRAVENOUS
  Administered 2016-08-24: 25 ug via INTRAVENOUS

## 2016-08-24 MED ORDER — HYDROCODONE-ACETAMINOPHEN 5-325 MG PO TABS: 1.0000 | ORAL_TABLET | ORAL | Status: DC | PRN

## 2016-08-24 MED ORDER — BUPIVACAINE-EPINEPHRINE (PF) 0.5% -1:200000 IJ SOLN
INTRAMUSCULAR | Status: DC | PRN
  Administered 2016-08-24: 25 mL

## 2016-08-24 MED ORDER — FENTANYL CITRATE (PF) 100 MCG/2ML IJ SOLN
INTRAMUSCULAR | Status: AC
  Administered 2016-08-24: 50 ug
  Filled 2016-08-24: qty 2

## 2016-08-24 MED ORDER — GABAPENTIN 300 MG PO CAPS
ORAL_CAPSULE | ORAL | Status: AC
  Filled 2016-08-24: qty 1

## 2016-08-24 SURGICAL SUPPLY — 54 items
APPLIER CLIP 9.375 MED OPEN (MISCELLANEOUS) ×3
ATCH SMKEVC FLXB CAUT HNDSWH (FILTER) ×1 IMPLANT
BENZOIN TINCTURE PRP APPL 2/3 (GAUZE/BANDAGES/DRESSINGS) ×3 IMPLANT
BINDER BREAST LRG (GAUZE/BANDAGES/DRESSINGS) ×3 IMPLANT
BIOPATCH RED 1 DISK 7.0 (GAUZE/BANDAGES/DRESSINGS) ×2 IMPLANT
BIOPATCH RED 1IN DISK 7.0MM (GAUZE/BANDAGES/DRESSINGS) ×1
CANISTER SUCT 3000ML PPV (MISCELLANEOUS) ×3 IMPLANT
CLIP APPLIE 9.375 MED OPEN (MISCELLANEOUS) ×1 IMPLANT
CLOSURE STERI-STRIP 1/2X4 (GAUZE/BANDAGES/DRESSINGS) ×1
CLOSURE WOUND 1/4X4 (GAUZE/BANDAGES/DRESSINGS)
CLSR STERI-STRIP ANTIMIC 1/2X4 (GAUZE/BANDAGES/DRESSINGS) ×2 IMPLANT
CONT SPEC 4OZ CLIKSEAL STRL BL (MISCELLANEOUS) ×3 IMPLANT
COVER SURGICAL LIGHT HANDLE (MISCELLANEOUS) ×3 IMPLANT
DRAIN CHANNEL 19F RND (DRAIN) ×3 IMPLANT
DRAPE CHEST BREAST 15X10 FENES (DRAPES) ×3 IMPLANT
DRAPE HALF SHEET 40X57 (DRAPES) ×3 IMPLANT
DRAPE UTILITY XL STRL (DRAPES) ×6 IMPLANT
DRSG PAD ABDOMINAL 8X10 ST (GAUZE/BANDAGES/DRESSINGS) ×3 IMPLANT
DRSG TEGADERM 2-3/8X2-3/4 SM (GAUZE/BANDAGES/DRESSINGS) ×3 IMPLANT
ELECT CAUTERY BLADE 6.4 (BLADE) ×3 IMPLANT
ELECT REM PT RETURN 9FT ADLT (ELECTROSURGICAL) ×3
ELECTRODE REM PT RTRN 9FT ADLT (ELECTROSURGICAL) ×1 IMPLANT
EVACUATOR SILICONE 100CC (DRAIN) ×3 IMPLANT
EVACUATOR SMOKE ACCUVAC VALLEY (FILTER) ×2
GAUZE SPONGE 4X4 12PLY STRL (GAUZE/BANDAGES/DRESSINGS) IMPLANT
GAUZE SPONGE 4X4 12PLY STRL LF (GAUZE/BANDAGES/DRESSINGS) ×3 IMPLANT
GLOVE BIOGEL PI IND STRL 8 (GLOVE) ×1 IMPLANT
GLOVE BIOGEL PI INDICATOR 8 (GLOVE) ×2
GLOVE SURG SIGNA 7.5 PF LTX (GLOVE) ×3 IMPLANT
GLOVE SURG SS PI 8.0 STRL IVOR (GLOVE) ×3 IMPLANT
GOWN STRL REUS W/ TWL LRG LVL3 (GOWN DISPOSABLE) ×2 IMPLANT
GOWN STRL REUS W/ TWL XL LVL3 (GOWN DISPOSABLE) ×1 IMPLANT
GOWN STRL REUS W/TWL LRG LVL3 (GOWN DISPOSABLE) ×4
GOWN STRL REUS W/TWL XL LVL3 (GOWN DISPOSABLE) ×2
KIT BASIN OR (CUSTOM PROCEDURE TRAY) ×3 IMPLANT
KIT ROOM TURNOVER OR (KITS) ×3 IMPLANT
NEEDLE FILTER BLUNT 18X 1/2SAF (NEEDLE) ×2
NEEDLE FILTER BLUNT 18X1 1/2 (NEEDLE) ×1 IMPLANT
NEEDLE HYPO 25GX1X1/2 BEV (NEEDLE) ×3 IMPLANT
NS IRRIG 1000ML POUR BTL (IV SOLUTION) ×3 IMPLANT
PACK GENERAL/GYN (CUSTOM PROCEDURE TRAY) ×3 IMPLANT
PAD ARMBOARD 7.5X6 YLW CONV (MISCELLANEOUS) ×3 IMPLANT
PREFILTER EVAC NS 1 1/3-3/8IN (MISCELLANEOUS) ×3 IMPLANT
SPECIMEN JAR X LARGE (MISCELLANEOUS) ×3 IMPLANT
SPONGE LAP 18X18 X RAY DECT (DISPOSABLE) IMPLANT
STRIP CLOSURE SKIN 1/4X4 (GAUZE/BANDAGES/DRESSINGS) IMPLANT
SUT ETHILON 2 0 FS 18 (SUTURE) ×6 IMPLANT
SUT MON AB 5-0 PS2 18 (SUTURE) ×3 IMPLANT
SUT VIC AB 3-0 SH 18 (SUTURE) ×6 IMPLANT
SYR CONTROL 10ML LL (SYRINGE) ×3 IMPLANT
TOWEL OR 17X24 6PK STRL BLUE (TOWEL DISPOSABLE) ×3 IMPLANT
TOWEL OR 17X26 10 PK STRL BLUE (TOWEL DISPOSABLE) ×3 IMPLANT
TUBE CONNECTING 12'X1/4 (SUCTIONS) ×2
TUBE CONNECTING 12X1/4 (SUCTIONS) ×4 IMPLANT

## 2016-08-24 NOTE — Anesthesia Procedure Notes (Signed)
Anesthesia Regional Block: Pectoralis block   Pre-Anesthetic Checklist: ,, timeout performed, Correct Patient, Correct Site, Correct Laterality, Correct Procedure, Correct Position, site marked, Risks and benefits discussed,  Surgical consent,  Pre-op evaluation,  At surgeon's request and post-op pain management  Laterality: Right  Prep: chloraprep       Needles:  Injection technique: Single-shot  Needle Type: Echogenic Needle     Needle Length: 9cm  Needle Gauge: 21     Additional Needles:   Procedures: ultrasound guided,,,,,,,,  Narrative:  Start time: 08/24/2016 10:17 AM End time: 08/24/2016 10:24 AM Injection made incrementally with aspirations every 5 mL.  Performed by: Personally  Anesthesiologist: Suzette Battiest

## 2016-08-24 NOTE — Op Note (Signed)
08/24/2016  12:14 PM  PATIENT:  Veronica Hartman, 71 y.o., female, MRN: [id number]  PREOP DIAGNOSIS:  RIGHT BREAST CANCER  POSTOP DIAGNOSIS:   Right breast cancer, 7 o'clock position (T1, N0)  PROCEDURE:   Procedure(s):  MASTECTOMY RIGHT WITH AXILLARY SENTINEL NODE BIOPSY, Injection of methylene blud, deep sentinel lymph node biopsy  SURGEON:   Alphonsa Overall, M.D.  ASSISTANT:   None  ANESTHESIA:   general  Anesthesiologist: Suzette Battiest, MD CRNA: Orlie Dakin, CRNA  General  ASA:  2  EBL:  75  ml  BLOOD ADMINISTERED: none  DRAINS: 19 F Blake drain  LOCAL MEDICATIONS USED:   Right pectoral block  SPECIMEN:   Right breast (suture lateral) and right axillary sentinel lymph node (faint blue, counts 8, background 0)  COUNTS CORRECT:  YES  INDICATIONS FOR PROCEDURE:  [name] is a 71 y.o. (DOB: 1945/08/09) white female whose primary care physician is [place], Coralie Keens, NP and comes for right mastectomy with right axillary sentinel lymph node biopsy   The indications and risks of the surgery were explained to the patient.  The risks include, but are not limited to, infection, bleeding, and nerve injury.  OPERATIVE NOTE;  The patient was taken to room # 8 at [facility] where she underwent a general anesthesia  supervised by Anesthesiologist: Suzette Battiest, MD CRNA: Orlie Dakin, CRNA. Her right breast and axilla were prepped with ChloraPrep and sterilely draped.    A time-out and the surgical check list was reviewed.    I injected about 1.0 mL of 40% methylene blue around her right areola.   I made an elliptical incision including the areola in the right breast.  I developed skin flaps medially to the lateral edge of the sternum, inferiorly to the investing fascia of the rectus abdominus muscle, laterally to the anterior edge of the latissimus dorsi muscle, and superiorly to about 2 finger breaths below the clavicle.  The breast was reflected off the  pectoralis muscle from medial to lateral.  The lateral attachments in the right axilla were divided and the breast removed.  A long suture was placed on the lateral aspect of the breast.   I dissected into the right axilla and found a deep sentinel lymph node.  The node had counts of 8 with a background count of 0.  The lymph node was faintly blue.  This was sent as a separate specimen.   I brought out one 76 F Blake drain below the inferior flaps.  This was sewn in place with 2-0 Nylons.  I irrigated the wound with 2,000 cc of fluid.   The skin was closed with interrupted 3-0 Vicryl sutures and the skin was closed with a 4-0 Monocryl.  The wound was painted with Dermabond.    A pressure dressing was placed on the wound and the chest wrapped with a breast binder.  Her needle and sponge count were correct at the end of the case.   She was transferred to the recovery room in good condition.  Alphonsa Overall, MD, [facility] Pager: [phone] Office phone:  [phone]

## 2016-08-24 NOTE — Anesthesia Postprocedure Evaluation (Signed)
Anesthesia Post Note  Patient: Veronica Hartman  Procedure(s) Performed: Procedure(s) (LRB): MASTECTOMY RIGHT WITH AXILLARY SENTINEL NODE BIOPSY (Right)     Patient location during evaluation: PACU Anesthesia Type: General Level of consciousness: awake and alert Pain management: pain level controlled Vital Signs Assessment: post-procedure vital signs reviewed and stable Respiratory status: spontaneous breathing, nonlabored ventilation, respiratory function stable and patient connected to nasal cannula oxygen Cardiovascular status: blood pressure returned to baseline and stable Postop Assessment: no signs of nausea or vomiting Anesthetic complications: no    Last Vitals:  Vitals:   08/24/16 1330 08/24/16 1344  BP: 129/64   Pulse: 83 82  Resp: (!) 23 20  Temp: (!) 36.3 C     Last Pain:  Vitals:   08/24/16 1330  TempSrc:   PainSc: 0-No pain                 Tiajuana Amass

## 2016-08-24 NOTE — Transfer of Care (Signed)
Immediate Anesthesia Transfer of Care Note  Patient: Veronica Hartman  Procedure(s) Performed: Procedure(s): MASTECTOMY RIGHT WITH AXILLARY SENTINEL NODE BIOPSY (Right)  Patient Location: PACU  Anesthesia Type:General  Level of Consciousness: drowsy  Airway & Oxygen Therapy: Patient Spontanous Breathing and Patient connected to face mask oxygen  Post-op Assessment: Report given to RN  Post vital signs: Reviewed and stable  Last Vitals:  Vitals:   08/24/16 1035 08/24/16 1232  BP: (!) 148/65 (!) 117/53  Pulse:  79  Resp:  18  Temp:  (!) 36.1 C    Last Pain:  Vitals:   08/24/16 0855  TempSrc: Oral      Patients Stated Pain Goal: 2 (14/99/69 2493)  Complications: No apparent anesthesia complications

## 2016-08-24 NOTE — Anesthesia Procedure Notes (Signed)
Procedure Name: LMA Insertion Date/Time: 08/24/2016 10:59 AM Performed by: Orlie Dakin Pre-anesthesia Checklist: Patient identified, Emergency Drugs available, Suction available, Patient being monitored and Timeout performed Patient Re-evaluated:Patient Re-evaluated prior to inductionOxygen Delivery Method: Circle system utilized Preoxygenation: Pre-oxygenation with 100% oxygen Intubation Type: IV induction Ventilation: Mask ventilation without difficulty LMA: LMA inserted LMA Size: 4.0 Number of attempts: 1 Placement Confirmation: positive ETCO2 and breath sounds checked- equal and bilateral Tube secured with: Tape Dental Injury: Teeth and Oropharynx as per pre-operative assessment  Comments: Noted very very poor dentition, some teeth loose per patient report, unable to specify which ones.   Many upper and lower front teeth broken off, blackened at gum line, and some missing upper front.

## 2016-08-24 NOTE — Anesthesia Preprocedure Evaluation (Addendum)
Anesthesia Evaluation  Patient identified by MRN, date of birth, ID band Patient awake    Reviewed: Allergy & Precautions, NPO status , Patient's Chart, lab work & pertinent test results  History of Anesthesia Complications (+) PONV  Airway Mallampati: II  TM Distance: >3 FB Neck ROM: Full    Dental  (+) Dental Advisory Given, Poor Dentition   Pulmonary Current Smoker,    Pulmonary exam normal        Cardiovascular negative cardio ROS Normal cardiovascular exam     Neuro/Psych Anxiety Depression negative neurological ROS     GI/Hepatic negative GI ROS, Neg liver ROS,   Endo/Other  negative endocrine ROS  Renal/GU negative Renal ROS     Musculoskeletal   Abdominal   Peds  Hematology negative hematology ROS (+)   Anesthesia Other Findings   Reproductive/Obstetrics                            Lab Results  Component Value Date   WBC 10.0 08/20/2016   HGB 14.8 08/20/2016   HCT 45.7 08/20/2016   MCV 95.6 08/20/2016   PLT 260 08/20/2016   Lab Results  Component Value Date   CREATININE 1.17 (H) 08/20/2016   BUN 12 08/20/2016   NA 139 08/20/2016   K 4.4 08/20/2016   CL 106 08/20/2016   CO2 24 08/20/2016    Anesthesia Physical Anesthesia Plan  ASA: II  Anesthesia Plan: General   Post-op Pain Management:  Regional for Post-op pain   Induction: Intravenous  PONV Risk Score and Plan: 3 and Ondansetron, Dexamethasone, Propofol and Treatment may vary due to age or medical condition  Airway Management Planned: LMA  Additional Equipment:   Intra-op Plan:   Post-operative Plan: Extubation in OR  Informed Consent: I have reviewed the patients History and Physical, chart, labs and discussed the procedure including the risks, benefits and alternatives for the proposed anesthesia with the patient or authorized representative who has indicated his/her understanding and acceptance.    Dental advisory given  Plan Discussed with: CRNA  Anesthesia Plan Comments:        Anesthesia Quick Evaluation

## 2016-08-24 NOTE — Interval H&P Note (Signed)
History and Physical Interval Note:  08/24/2016 10:51 AM  Veronica Hartman  has presented today for surgery, with the diagnosis of RIGHT BREAST CANCER  The various methods of treatment have been discussed with the patient and family.  Some confusion about sentinel lymph node biopsy, but all is cleared up.    After consideration of risks, benefits and other options for treatment, the patient has consented to  Procedure(s): MASTECTOMY RIGHT WITH AXILLARY SENTINEL NODE BIOPSY (Right) as a surgical intervention .  The patient's history has been reviewed, patient examined, no change in status, stable for surgery.  I have reviewed the patient's chart and labs.  Questions were answered to the patient's satisfaction.     Kenosha Doster H

## 2016-08-25 ENCOUNTER — Encounter (HOSPITAL_COMMUNITY): Payer: Self-pay | Admitting: Surgery

## 2016-08-25 DIAGNOSIS — C50411 Malignant neoplasm of upper-outer quadrant of right female breast: Secondary | ICD-10-CM | POA: Diagnosis not present

## 2016-08-25 MED ORDER — HYDROCODONE-ACETAMINOPHEN 5-325 MG PO TABS: 1.0000 | ORAL_TABLET | Freq: Four times a day (QID) | ORAL | 0 refills | Status: DC | PRN

## 2016-08-25 NOTE — Progress Notes (Signed)
Patient discharged to home with instructions and prescriptions. 

## 2016-08-25 NOTE — Discharge Instructions (Signed)
[facility] - DISCHARGE INSTRUCTIONS TO PATIENT  Activity:  Driving - May drive in 2 or 3 days, if doing well   Lifting - No lifting more than 15 pounds for 1 week, then no limit  Wound Care:   Keep wound dry for 2 more days, then may shower.       Empty drain at least once a day and record the amount of drainage.  Bring the sheet that has the amount of drainage to the office.  The amount of drainage will dictate when the drain is removed.  Diet:  As tolerated  Follow up appointment:  Call Dr. Pollie Friar office [facility]) at [phone] for any questions.  You have an appointment in about 10 days.  Medications and dosages:  Resume your home medications.  You have a prescription for:  Vicodin  Call Dr. Lucia Gaskins or his office  [phone]) if you have:  Temperature greater than 100.4,  Persistent nausea and vomiting,  Severe uncontrolled pain,  Redness, tenderness, or signs of infection (pain, swelling, redness, odor or green/yellow discharge around the site),  Difficulty breathing, headache or visual disturbances,  Any other questions or concerns you may have after discharge.  In an emergency, call 911 or go to an Emergency Department at a nearby hospital.

## 2016-08-25 NOTE — Discharge Summary (Signed)
Physician Discharge Summary  Patient ID:  Veronica Hartman  MRN: [id number]  DOB/AGE: 10/19/1945 70 y.o.  Admit date: 08/24/2016 Discharge date: 08/25/2016  Discharge Diagnoses:  1.  BREAST CANCER, STAGE 2, RIGHT (C50.911)             Story: Right breast biopsy on 08/05/2016 ([id number]) - IDC, grade 2/3, ER - 100%, PR - 20%, Ki67 - 15%, Her2Neu neg.             Final pathology pending 2.  HISTORY OF LEFT BREAST CANCER (Z85.3) 3.  Smokes about 1 ppd 4.  Very poor dentition  Active Problems:   Breast cancer of lower-outer quadrant of right female breast (HCC)  Operation: Procedure(s): MASTECTOMY RIGHT WITH AXILLARY SENTINEL NODE BIOPSY on 08/24/2016 - D. Newman  Discharged Condition: good  Hospital Course: Veronica Hartman is an 70 y.o. female whose primary care physician is Baity, Regina W, NP and who was admitted 08/24/2016 with a chief complaint of right breast cancer. She was brought to the operating room on 08/24/2016 and underwent  MASTECTOMY RIGHT WITH AXILLARY SENTINEL NODE BIOPSY.  She is now one day post op.  She has taken no pain meds.  She is ready to go home.   The discharge instructions were reviewed with the patient.  Consults: None  Significant Diagnostic Studies: Results for orders placed or performed during the hospital encounter of 08/20/16  CBC  Result Value Ref Range   WBC 10.0 4.0 - 10.5 K/uL   RBC 4.78 3.87 - 5.11 MIL/uL   Hemoglobin 14.8 12.0 - 15.0 g/dL   HCT 45.7 36.0 - 46.0 %   MCV 95.6 78.0 - 100.0 fL   MCH 31.0 26.0 - 34.0 pg   MCHC 32.4 30.0 - 36.0 g/dL   RDW 14.3 11.5 - 15.5 %   Platelets 260 150 - 400 K/uL  Comprehensive metabolic panel  Result Value Ref Range   Sodium 139 135 - 145 mmol/L   Potassium 4.4 3.5 - 5.1 mmol/L   Chloride 106 101 - 111 mmol/L   CO2 24 22 - 32 mmol/L   Glucose, Bld 90 65 - 99 mg/dL   BUN 12 6 - 20 mg/dL   Creatinine, Ser 1.17 (H) 0.44 - 1.00 mg/dL   Calcium 9.5 8.9 - 10.3 mg/dL   Total Protein 7.0 6.5 - 8.1 g/dL    Albumin 4.3 3.5 - 5.0 g/dL   AST 25 15 - 41 U/L   ALT 14 14 - 54 U/L   Alkaline Phosphatase 87 38 - 126 U/L   Total Bilirubin 0.7 0.3 - 1.2 mg/dL   GFR calc non Af Amer 46 (L) >60 mL/min   GFR calc Af Amer 53 (L) >60 mL/min   Anion gap 9 5 - 15    Us Breast Ltd Uni Right Inc Axilla  Result Date: 08/04/2016 CLINICAL DATA:  70-year-old with personal history of left breast cancer and remote left mastectomy, presenting now with a palpable lump in the right breast which she states she noticed initially several months ago. The patient states that her last mammogram was in the late 1990s. EXAM: 2D DIGITAL DIAGNOSTIC RIGHT MAMMOGRAM WITH CAD AND ADJUNCT TOMO ULTRASOUND RIGHT BREAST COMPARISON:  None. ACR Breast Density Category c: The breast tissue is heterogeneously dense, which may obscure small masses. FINDINGS: Standard 2D and tomosynthesis full Morriss CC and MLO views of the right breast were obtained. A standard and tomosynthesis spot tangential view of the area of palpable   concern in the right breast and standard spot magnification CC and mediolateral views of right breast calcifications were obtained. Corresponding to the area of palpable concern is a mass with irregular margins measuring approximately 2.5 cm, associated with suspicious microcalcifications. Spot magnification images confirm a group of coarse heterogeneous calcifications in the upper outer quadrant which span approximately 9 mm, without an associated mass or architectural distortion. These calcifications are approximately 5 cm superior to the mass. Mammographic images were processed with CAD. On physical exam, there is a firm fixed palpable approximate 2 cm mass in the lower outer quadrant of the right breast. There is associated mild skin retraction when the patient raises the right arm. Targeted right breast ultrasound is performed, showing a hypoechoic mass with irregular margins at the 7 o'clock position approximately 3 cm from the  nipple measuring approximately 1.3 x 2.1 x 2.0 cm, containing microcalcifications, with dermal skin involvement, demonstrating mixed posterior characteristics and demonstrating internal power Doppler flow, corresponding to the area of palpable concern. Sonographic evaluation of the right axilla demonstrates no pathologic lymphadenopathy. IMPRESSION: 1. Highly suspicious 2.1 cm mass involving the lower outer quadrant of the right breast with associated microcalcifications and dermal skin involvement. 2. Indeterminate 9 mm group of coarse heterogeneous calcifications in the upper outer quadrant of the right breast. These calcifications are approximately 5 cm superior to the suspicious mass. 3. No pathologic right axillary lymphadenopathy. RECOMMENDATION: 1. Ultrasound-guided core needle biopsy of the highly suspicious palpable right breast mass. 2. Stereotactic tomosynthesis guided core needle biopsy of the indeterminate right breast calcifications. The needle biopsy procedures were discussed with the patient and her questions were answered. She has agreed to proceed and both biopsies have been scheduled for Wednesday, June 20. I have discussed the findings and recommendations with the patient. Results were also provided in writing at the conclusion of the visit. If applicable, a reminder letter will be sent to the patient regarding the next appointment. BI-RADS CATEGORY  5: Highly suggestive of malignancy. Electronically Signed   By: Thomas  Lawrence M.D.   On: 07/31/2016 14:56   Mm Diag Breast Tomo Uni Right  Result Date: 07/31/2016 CLINICAL DATA:  70-year-old with personal history of left breast cancer and remote left mastectomy, presenting now with a palpable lump in the right breast which she states she noticed initially several months ago. The patient states that her last mammogram was in the late 1990s. EXAM: 2D DIGITAL DIAGNOSTIC RIGHT MAMMOGRAM WITH CAD AND ADJUNCT TOMO ULTRASOUND RIGHT BREAST COMPARISON:   None. ACR Breast Density Category c: The breast tissue is heterogeneously dense, which may obscure small masses. FINDINGS: Standard 2D and tomosynthesis full Mcqueen CC and MLO views of the right breast were obtained. A standard and tomosynthesis spot tangential view of the area of palpable concern in the right breast and standard spot magnification CC and mediolateral views of right breast calcifications were obtained. Corresponding to the area of palpable concern is a mass with irregular margins measuring approximately 2.5 cm, associated with suspicious microcalcifications. Spot magnification images confirm a group of coarse heterogeneous calcifications in the upper outer quadrant which span approximately 9 mm, without an associated mass or architectural distortion. These calcifications are approximately 5 cm superior to the mass. Mammographic images were processed with CAD. On physical exam, there is a firm fixed palpable approximate 2 cm mass in the lower outer quadrant of the right breast. There is associated mild skin retraction when the patient raises the right arm. Targeted right breast ultrasound   is performed, showing a hypoechoic mass with irregular margins at the 7 o'clock position approximately 3 cm from the nipple measuring approximately 1.3 x 2.1 x 2.0 cm, containing microcalcifications, with dermal skin involvement, demonstrating mixed posterior characteristics and demonstrating internal power Doppler flow, corresponding to the area of palpable concern. Sonographic evaluation of the right axilla demonstrates no pathologic lymphadenopathy. IMPRESSION: 1. Highly suspicious 2.1 cm mass involving the lower outer quadrant of the right breast with associated microcalcifications and dermal skin involvement. 2. Indeterminate 9 mm group of coarse heterogeneous calcifications in the upper outer quadrant of the right breast. These calcifications are approximately 5 cm superior to the suspicious mass. 3. No  pathologic right axillary lymphadenopathy. RECOMMENDATION: 1. Ultrasound-guided core needle biopsy of the highly suspicious palpable right breast mass. 2. Stereotactic tomosynthesis guided core needle biopsy of the indeterminate right breast calcifications. The needle biopsy procedures were discussed with the patient and her questions were answered. She has agreed to proceed and both biopsies have been scheduled for Wednesday, June 20. I have discussed the findings and recommendations with the patient. Results were also provided in writing at the conclusion of the visit. If applicable, a reminder letter will be sent to the patient regarding the next appointment. BI-RADS CATEGORY  5: Highly suggestive of malignancy. Electronically Signed   By: Thomas  Lawrence M.D.   On: 07/31/2016 14:56   Mm Clip Placement Right  Result Date: 08/05/2016 CLINICAL DATA:  Two biopsies were performed of the right breast today, including a stereotactic biopsy of coarse heterogeneous calcifications in the upper-outer quadrant and an ultrasound-guided biopsy of a suspicious mass in the 7 o'clock position of the right breast. EXAM: DIAGNOSTIC RIGHT MAMMOGRAM POST STEREOTACTIC AND ULTRASOUND BIOPSY COMPARISON:  Previous exam(s). FINDINGS: Mammographic images were obtained following stereotactic guided and ultrasound-guided biopsies of the right breast performed today. A coil shaped biopsy clip is satisfactorily positioned at the site of the biopsied calcifications in the upper-outer quadrant of the right breast. A ribbon shaped biopsy clip is satisfactorily positioned in the biopsied mass in the inferior right breast. IMPRESSION: Satisfactory position of coil shaped and ribbon shaped biopsy clips in the right breast. Final Assessment: Post Procedure Mammograms for Marker Placement Electronically Signed   By: Susan  Turner M.D.   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 This was found to be concordant by Dr. Susan Turner. Pathology results were discussed with the patient by telephone. The patient reported doing well after the biopsies. Post biopsy instructions and care were reviewed and questions were answered. The patient was encouraged to call [facility] for any additional concerns. Surgical consultation has been arranged with Dr. David Newman at [facility] on August 18, 2016. Pathology results reported by Leigh Kuhnly RN, BSN on 08/07/2016. Electronically Signed   By: Susan  Turner M.D.   On: 08/07/2016 08:24   Result Date: 08/07/2016 CLINICAL DATA:  Stereotactic biopsy is recommended a group of coarse heterogeneous calcifications in the far upper outer quadrant of the right breast. The patient has no prior mammograms available for review. She has a history of left breast cancer, status post mastectomy in the 1990s. EXAM: RIGHT BREAST STEREOTACTIC CORE   NEEDLE BIOPSY COMPARISON:  Previous exams. FINDINGS: The patient and I discussed the procedure of stereotactic-guided biopsy including benefits and alternatives. We discussed the high likelihood of a successful procedure. We discussed the risks of the procedure including infection, bleeding, tissue injury, clip migration, and inadequate sampling. Informed written consent was given. The usual time out protocol was performed immediately prior to the procedure. Using sterile technique and 1% Lidocaine as local anesthetic, under stereotactic guidance, a 9 gauge vacuum assisted device was used to perform core needle biopsy of calcifications in the upper-outer quadrant of the right breast using a superior to inferior approach. Specimen radiograph was performed  showing multiple calcifications. Specimens with calcifications are identified for pathology. Lesion quadrant: Upper outer quadrant At the conclusion of the procedure, a tissue marker clip was deployed into the biopsy cavity. Follow-up 2-view mammogram was performed and dictated separately. IMPRESSION: Stereotactic-guided biopsy of the upper-outer quadrant of the right breast. No apparent complications. Electronically Signed: By: Susan  Turner M.D. 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 This was found to be concordant by Dr. Susan Turner. Pathology results were discussed with the patient by telephone. The patient reported doing well after the biopsies. Post biopsy instructions and care were reviewed and questions were answered. The patient was encouraged to call [facility] for any additional concerns. Surgical consultation has been arranged with Dr. David Newman at [facility] on August 18, 2016. Pathology results reported by Leigh Kuhnly RN, BSN on 08/07/2016. Electronically Signed   By: Susan  Turner M.D.   On: 08/07/2016 08:24   Result Date: 08/07/2016 CLINICAL DATA:  Suspicious palpable mass in the 7 o'clock position of the right breast. EXAM: ULTRASOUND GUIDED RIGHT BREAST CORE NEEDLE BIOPSY COMPARISON:  Previous exam(s). FINDINGS: I met with the patient and we discussed the procedure of ultrasound-guided biopsy, including benefits and alternatives. We discussed the high likelihood of a successful procedure. We discussed the risks of the procedure, including infection, bleeding, tissue injury, clip migration, and inadequate sampling. Informed written consent was given. The  usual time-out protocol was performed immediately prior to the procedure. Lesion quadrant: Lower outer quadrant Using sterile technique and 1% Lidocaine as local anesthetic, under direct ultrasound visualization, a 12 gauge spring-loaded device was used to perform biopsy of a suspicious palpable mass in the 7 o'clock position the right breast using a lateral to medial approach. At the conclusion of the procedure a ribbon tissue marker clip was deployed into the biopsy cavity. Follow up 2 view mammogram was performed and dictated separately. IMPRESSION: Ultrasound guided biopsy of the right breast, lower outer quadrant. No apparent complications. Electronically Signed: By: Susan  Turner M.D. On: 08/05/2016 15:17    Discharge Exam:  Vitals:   08/25/16 0141 08/25/16 0626  BP: (!) 113/58 105/61  Pulse: 73 66  Resp: 17 17  Temp: 98.1 F (36.7 C) 98.4 F (36.9 C)    General: Thin WN WF who is alert and generally healthy appearing.  Lungs: Clear to auscultation and symmetric breath sounds. Heart:  RRR. No murmur or rub. Chest:  Right breast wound looks okay. Drain - 60 cc recorded last 24 hours  Discharge Medications:     Allergies as of 08/25/2016      Reactions   Sulfa Antibiotics    Childhood allergy      Medication List    TAKE these medications   HYDROcodone-acetaminophen 5-325 MG tablet Commonly known as:  NORCO/VICODIN Take 1-2 tablets by mouth every 6 (six) hours as needed for moderate pain.       Disposition: 01-Home or Self Care  Discharge Instructions    Diet - low sodium heart healthy    Complete by:  As directed    Increase activity slowly    Complete by:  As directed      Activity:  Driving - May drive in 2 or 3 days, if doing well   Lifting - No lifting more than 15 pounds for 1 week, then no limit  Wound Care:   Keep wound dry for 2 more days, then may shower.       Empty drain at least once a day and record the amount of drainage.  Bring the sheet that has the  amount of drainage to the office.  The amount of drainage will dictate when the drain is removed.  Diet:  As tolerated  Follow up appointment:  Call Dr. Newman's office ([facility]) at [phone] for any questions.  You have an appointment in about 10 days.  Medications and dosages:  Resume your home medications.  You have a prescription for:  Vicodin   Signed: David Newman, M.D., FACS [facility] Office:  [phone]  08/25/2016, 7:37 AM    

## 2016-09-01 ENCOUNTER — Telehealth: Payer: Self-pay | Admitting: *Deleted

## 2016-09-01 NOTE — Telephone Encounter (Signed)
Left vm for pt to return call to discuss importance of meeting with med onc. Contact information provided.

## 2016-09-07 ENCOUNTER — Telehealth: Payer: Self-pay | Admitting: Hematology

## 2016-09-07 NOTE — Telephone Encounter (Signed)
Pt cld to reschedule appt. Appt has been rescheduled for the pt to see Dr. Irene Limbo on 8/16 at 3pm. Pt agreed to the appt date and time.

## 2016-09-10 ENCOUNTER — Telehealth: Payer: Self-pay | Admitting: Hematology

## 2016-09-10 NOTE — Telephone Encounter (Signed)
Telephone encounter created on 7/23 is an error. No appt on 8/16 w/Kale for this pt

## 2016-09-11 ENCOUNTER — Telehealth: Payer: Self-pay | Admitting: *Deleted

## 2016-09-11 NOTE — Telephone Encounter (Signed)
Left vm requesting return call to discuss scheduling appt. Contact information provided.

## 2016-09-21 ENCOUNTER — Telehealth: Payer: Self-pay | Admitting: *Deleted

## 2016-09-21 NOTE — Telephone Encounter (Signed)
Left vm for pt to return call to discuss appt with med on. Contact information provided. Informed Dr. Lucia Gaskins pt not returning calls to schedule appt.

## 2017-05-31 ENCOUNTER — Encounter: Payer: Self-pay | Admitting: Family Medicine

## 2017-05-31 ENCOUNTER — Ambulatory Visit: Payer: Medicare Other | Admitting: Family Medicine

## 2017-05-31 VITALS — BP 146/82 | HR 104 | Temp 98.0°F | Wt 115.5 lb

## 2017-05-31 DIAGNOSIS — J309 Allergic rhinitis, unspecified: Secondary | ICD-10-CM

## 2017-05-31 DIAGNOSIS — K0889 Other specified disorders of teeth and supporting structures: Secondary | ICD-10-CM | POA: Diagnosis not present

## 2017-05-31 NOTE — Patient Instructions (Addendum)
For allergies you can take some over the counter allergy medication- cetirizine or loratadine  Can also use saline nasal spray.   Continue as needed ibuprofen and can rinse with warm salt water   For dental care, see attached information. You will need to get your Medicaid reinstated.

## 2017-05-31 NOTE — Progress Notes (Signed)
Subjective:    Patient ID: Veronica Hartman, female    DOB: 1946-02-03, 72 y.o.   MRN: [id number]  HPI This is a 72 yo female who presents today with a couple of days of sinus symptoms. Has had three days of tooth ache.  Subjective low grade fevers. Feels shaky and "not right." Clear nasal drainage has decreased over last 24 hours and she feels a little better today. Occasional cough, intermittent sore throat with cough. No SOB, no wheeze.  Poor dentition. She is concerned that she has an abscess. No bad taste or drainage in her mouth.  No recent visit with PCP, has visit scheduled next month. Had mastectomy (right) last year, declined additional follow up with medical oncology. Reports she desires no additional treatment. History left mastectomy many years ago.   Past Medical History:  Diagnosis Date  . Alcohol abuse    in past and went through rehab and AA  . Allergy   . Anxiety   . Cancer [facility]) 1995   breast left  . Depression    in the past but not currently  . Pneumonia    "has not had pneumonia in the last 13 years"  . PONV (postoperative nausea and vomiting)    Past Surgical History:  Procedure Laterality Date  . BREAST SURGERY     biopsy 1995  . MASTECTOMY Left 1995  . MASTECTOMY W/ SENTINEL NODE BIOPSY Right 08/24/2016  . MASTECTOMY WITH AXILLARY LYMPH NODE DISSECTION Right 08/24/2016   Procedure: MASTECTOMY RIGHT WITH AXILLARY SENTINEL NODE BIOPSY;  Surgeon: Alphonsa Overall, MD;  Location: [facility];  Service: General;  Laterality: Right;   Family History  Problem Relation Age of Onset  . Depression Mother   . Hyperlipidemia Father   . Heart disease Maternal Grandfather    Social History   Tobacco Use  . Smoking status: Current Every Day Smoker    Packs/day: 1.00    Years: 51.00    Pack years: 51.00  . Smokeless tobacco: Never Used  Substance Use Topics  . Alcohol use: Yes    Alcohol/week: 12.6 oz    Types: 21 Cans of beer per week    Comment: 2018 " i ONLY  DRINK A BEER OR 2 A MONTH "  . Drug use: No      Review of Systems Per HPI    Objective:   Physical Exam  Constitutional: She is oriented to person, place, and time. She appears well-developed and well-nourished. No distress.  HENT:  Head: Normocephalic and atraumatic.  Right Ear: Tympanic membrane, external ear and ear canal normal.  Left Ear: Tympanic membrane, external ear and ear canal normal.  Nose: Mucosal edema present.  Mouth/Throat: Uvula is midline, oropharynx is clear and moist and mucous membranes are normal. Abnormal dentition. Dental caries present. No dental abscesses.  Multiple rotten and broken teeth. Area of pain examined and no erythema, drainage or swelling. Mildly tender to palpation of lower gum line.   Eyes: Conjunctivae are normal.  Neck: Normal range of motion. Neck supple.  Cardiovascular: Normal rate, regular rhythm and normal heart sounds.  Pulmonary/Chest: Effort normal and breath sounds normal.  Lymphadenopathy:    She has no cervical adenopathy.  Neurological: She is alert and oriented to person, place, and time.  Skin: Skin is warm and dry. She is not diaphoretic.  Psychiatric: She has a normal mood and affect. Her behavior is normal. Judgment and thought content normal.  Vitals reviewed.  BP (!) 146/82 (BP Location: Right Arm, Patient Position: Sitting, Cuff Size: Normal)   Pulse (!) 104   Temp 98 F (36.7 C) (Oral)   Wt 115 lb 8 oz (52.4 kg)   SpO2 96%   BMI 20.46 kg/m  Wt Readings from Last 3 Encounters:  05/31/17 115 lb 8 oz (52.4 kg)  08/24/16 114 lb (51.7 kg)  08/20/16 114 lb 3.2 oz (51.8 kg)   BP Readings from Last 3 Encounters:  05/31/17 (!) 146/82  08/25/16 105/61  08/20/16 (!) 154/71       Assessment & Plan:  1. Pain, dental - no signs abscess today - RTC precautions reviewed - provided patient a list of dental resources and encouraged her to make an appointment - OTC analgesics, warm salt water gargles  2.  Allergic rhinitis, unspecified seasonality, unspecified trigger - Provided written and verbal information regarding diagnosis and treatment. - suggested OTC antihistamines and saline rinses  - follow up with PCP as scheduled next month  Clarene Reamer, FNP-BC  [facility] [place]  06/01/2017 9:47 PM

## 2017-06-01 ENCOUNTER — Encounter: Payer: Self-pay | Admitting: Family Medicine

## 2017-07-08 ENCOUNTER — Encounter: Payer: Self-pay | Admitting: Internal Medicine

## 2017-07-08 ENCOUNTER — Other Ambulatory Visit: Payer: Medicare Other

## 2017-07-08 ENCOUNTER — Ambulatory Visit (INDEPENDENT_AMBULATORY_CARE_PROVIDER_SITE_OTHER): Payer: Medicare Other | Admitting: Internal Medicine

## 2017-07-08 VITALS — BP 140/90 | HR 78 | Temp 97.7°F | Ht 63.0 in | Wt 113.0 lb

## 2017-07-08 DIAGNOSIS — Z1159 Encounter for screening for other viral diseases: Secondary | ICD-10-CM

## 2017-07-08 DIAGNOSIS — Z13220 Encounter for screening for lipoid disorders: Secondary | ICD-10-CM | POA: Diagnosis not present

## 2017-07-08 DIAGNOSIS — Z853 Personal history of malignant neoplasm of breast: Secondary | ICD-10-CM

## 2017-07-08 DIAGNOSIS — Z0000 Encounter for general adult medical examination without abnormal findings: Secondary | ICD-10-CM | POA: Diagnosis not present

## 2017-07-08 DIAGNOSIS — E559 Vitamin D deficiency, unspecified: Secondary | ICD-10-CM | POA: Diagnosis not present

## 2017-07-08 DIAGNOSIS — I10 Essential (primary) hypertension: Secondary | ICD-10-CM | POA: Insufficient documentation

## 2017-07-08 DIAGNOSIS — C50511 Malignant neoplasm of lower-outer quadrant of right female breast: Secondary | ICD-10-CM

## 2017-07-08 DIAGNOSIS — F419 Anxiety disorder, unspecified: Secondary | ICD-10-CM

## 2017-07-08 DIAGNOSIS — F329 Major depressive disorder, single episode, unspecified: Secondary | ICD-10-CM | POA: Diagnosis not present

## 2017-07-08 DIAGNOSIS — F32A Depression, unspecified: Secondary | ICD-10-CM

## 2017-07-08 LAB — COMPREHENSIVE METABOLIC PANEL
ALT: 11 U/L (ref 0–35)
AST: 17 U/L (ref 0–37)
Albumin: 4.5 g/dL (ref 3.5–5.2)
Alkaline Phosphatase: 103 U/L (ref 39–117)
BUN: 16 mg/dL (ref 6–23)
CO2: 28 meq/L (ref 19–32)
CREATININE: 1.04 mg/dL (ref 0.40–1.20)
Calcium: 10 mg/dL (ref 8.4–10.5)
Chloride: 104 mEq/L (ref 96–112)
GFR: 55.29 mL/min — ABNORMAL LOW (ref >60.00)
GLUCOSE: 89 mg/dL (ref 70–99)
Potassium: 4.6 mEq/L (ref 3.5–5.1)
SODIUM: 141 meq/L (ref 135–145)
Total Bilirubin: 0.6 mg/dL (ref 0.2–1.2)
Total Protein: 7.5 g/dL (ref 6.0–8.3)

## 2017-07-08 LAB — CBC
HCT: 45.4 % (ref 36.0–46.0)
Hemoglobin: 15 g/dL (ref 12.0–15.0)
MCHC: 33.1 g/dL (ref 30.0–36.0)
MCV: 94.3 fl (ref 78.0–100.0)
Platelets: 242 10*3/uL (ref 150.0–400.0)
RBC: 4.81 Mil/uL (ref 3.87–5.11)
RDW: 14 % (ref 11.5–15.5)
WBC: 8 10*3/uL (ref 4.0–10.5)

## 2017-07-08 LAB — LIPID PANEL
CHOL/HDL RATIO: 3
Cholesterol: 196 mg/dL (ref 0–200)
HDL: 68.2 mg/dL (ref >39.00)
LDL CALC: 112 mg/dL — AB (ref 0–99)
NONHDL: 127.3
Triglycerides: 79 mg/dL (ref 0.0–149.0)
VLDL: 15.8 mg/dL (ref 0.0–40.0)

## 2017-07-08 LAB — VITAMIN D 25 HYDROXY (VIT D DEFICIENCY, FRACTURES): VITD: 20.6 ng/mL — AB (ref 30.00–100.00)

## 2017-07-08 NOTE — Assessment & Plan Note (Signed)
S/p mastectomy She is choosing not to follow with oncology

## 2017-07-08 NOTE — Progress Notes (Signed)
HPI:  Pt presents to the clinic today for her Medicare Wellness Exam. She is also due to follow up chronic conditions.  Anxiety and Depression: She reports this has been an intermittent issue for her. Being diagnosed with cancer again has gotten her down a little. She has been treated with antidepressants and group therapy in the past but does not feel like she needs treatment at this time.  Hx of Left Breast Cancer: s/p mastectomy. No chemo or radiation.  Right Breast Cancer: s/p mastectomy. No chemo or radiation. She does not follow with oncology.  HTN: her BP today is 140/90. Last 2 blood pressures were 146/82, 154/71. She denies headache, dizziness, chest pain or shortness of breath.  Past Medical History:  Diagnosis Date  . Alcohol abuse    in past and went through rehab and AA  . Allergy   . Anxiety   . Cancer [facility]) 1995   breast left  . Depression    in the past but not currently  . Pneumonia    "has not had pneumonia in the last 13 years"  . PONV (postoperative nausea and vomiting)     No current outpatient medications on file.   No current facility-administered medications for this visit.     Allergies  Allergen Reactions  . Sulfa Antibiotics     Childhood allergy    Family History  Problem Relation Age of Onset  . Depression Mother   . Hyperlipidemia Father   . Heart disease Maternal Grandfather     Social History   Socioeconomic History  . Marital status: Divorced    Spouse name: Not on file  . Number of children: Not on file  . Years of education: Not on file  . Highest education level: Not on file  Occupational History  . Not on file  Social Needs  . Financial resource strain: Not on file  . Food insecurity:    Worry: Not on file    Inability: Not on file  . Transportation needs:    Medical: Not on file    Non-medical: Not on file  Tobacco Use  . Smoking status: Current Every Day Smoker    Packs/day: 1.00    Years: 51.00    Pack years:  51.00  . Smokeless tobacco: Never Used  Substance and Sexual Activity  . Alcohol use: Yes    Alcohol/week: 12.6 oz    Types: 21 Cans of beer per week    Comment: 2018 " i ONLY DRINK A BEER OR 2 A MONTH "  . Drug use: No  . Sexual activity: Not Currently  Lifestyle  . Physical activity:    Days per week: Not on file    Minutes per session: Not on file  . Stress: Not on file  Relationships  . Social connections:    Talks on phone: Not on file    Gets together: Not on file    Attends religious service: Not on file    Active member of club or organization: Not on file    Attends meetings of clubs or organizations: Not on file    Relationship status: Not on file  . Intimate partner violence:    Fear of current or ex partner: Not on file    Emotionally abused: Not on file    Physically abused: Not on file    Forced sexual activity: Not on file  Other Topics Concern  . Not on file  Social History Narrative  . Not  on file    Hospitiliaztions: None  Health Maintenance:    Flu: never  Tetanus: about 10 years ago  Pneumovax: > 5 years ago  Prevnar: never  Zostavax: never  Shingrix: never  Mammogram: 07/2016  Pap Smear:  > 5 years ago  Bone Density: never  Colon Screening: > 10 years ago  Eye Doctor: as needed  Dental Exam: as needed   Providers:   PCP: Webb Silversmith, NP-C  Oncologist: Dr. Irene Limbo     I have personally reviewed and have noted:  1. The patient's medical and social history 2. Their use of alcohol, tobacco or illicit drugs 3. Their current medications and supplements 4. The patient's functional ability including ADL's, fall risks, home safety risks and hearing or visual impairment. 5. Diet and physical activities 6. Evidence for depression or mood disorder  Subjective:   Review of Systems:   Constitutional: Denies fever, malaise, fatigue, headache or abrupt weight changes.  HEENT: Denies eye pain, eye redness, ear pain, ringing in the ears, wax  buildup, runny nose, nasal congestion, bloody nose, or sore throat. Respiratory: Denies difficulty breathing, shortness of breath, cough or sputum production.   Cardiovascular: Denies chest pain, chest tightness, palpitations or swelling in the hands or feet.  Gastrointestinal: Denies abdominal pain, bloating, constipation, diarrhea or blood in the stool.  GU: Denies urgency, frequency, pain with urination, burning sensation, blood in urine, odor or discharge. Musculoskeletal: Denies decrease in range of motion, difficulty with gait, muscle pain or joint pain and swelling.  Skin: Denies redness, rashes, lesions or ulcercations.  Neurological: Denies dizziness, difficulty with memory, difficulty with speech or problems with balance and coordination.  Psych: Denies anxiety, depression, SI/HI.  No other specific complaints in a complete review of systems (except as listed in HPI above).  Objective:  PE:   BP 140/90   Pulse 78   Temp 97.7 F (36.5 C) (Oral)   Ht 5\' 3"  (1.6 m)   Wt 113 lb (51.3 kg)   SpO2 98%   BMI 20.02 kg/m   Wt Readings from Last 3 Encounters:  05/31/17 115 lb 8 oz (52.4 kg)  08/24/16 114 lb (51.7 kg)  08/20/16 114 lb 3.2 oz (51.8 kg)    General: Appears her stated age, in NAD. Skin: Warm, dry and intact.  HEENT: Head: normal shape and size; Eyes: sclera white, no icterus, conjunctiva pink, PERRLA and EOMs intact; Ears: Tm's gray and intact, normal light reflex; Throat/Mouth: Mucosa pink and moist, no exudate, lesions or ulcerations noted.  Neck: Neck supple, trachea midline. No masses, lumps or thyromegaly present.  Cardiovascular: Normal rate and rhythm. S1,S2 noted.  No murmur, rubs or gallops noted. No JVD or BLE edema. No carotid bruits noted. Pulmonary/Chest: Normal effort and positive vesicular breath sounds. No respiratory distress. No wheezes, rales or ronchi noted.  Abdomen: Soft and nontender. Normal bowel sounds. No distention or masses noted. Liver,  spleen and kidneys non palpable. Musculoskeletal:  Strength 5/5 BUE/BLE. No signs of joint swelling.  Neurological: Alert and oriented. Cranial nerves II-XII grossly intact. Coordination normal.  Psychiatric: Mood and affect normal. Behavior is normal. Judgment and thought content normal.     BMET    Component Value Date/Time   NA 139 08/20/2016 1418   K 4.4 08/20/2016 1418   CL 106 08/20/2016 1418   CO2 24 08/20/2016 1418   GLUCOSE 90 08/20/2016 1418   BUN 12 08/20/2016 1418   CREATININE 1.17 (H) 08/20/2016 1418   CALCIUM 9.5  08/20/2016 1418   GFRNONAA 46 (L) 08/20/2016 1418   GFRAA 53 (L) 08/20/2016 1418    Lipid Panel  No results found for: CHOL, TRIG, HDL, CHOLHDL, VLDL, LDLCALC  CBC    Component Value Date/Time   WBC 10.0 08/20/2016 1418   RBC 4.78 08/20/2016 1418   HGB 14.8 08/20/2016 1418   HCT 45.7 08/20/2016 1418   PLT 260 08/20/2016 1418   MCV 95.6 08/20/2016 1418   MCH 31.0 08/20/2016 1418   MCHC 32.4 08/20/2016 1418   RDW 14.3 08/20/2016 1418    Hgb A1C No results found for: HGBA1C    Assessment and Plan:   Medicare Annual Wellness Visit:  Diet: She eats lean meat. She consumes fruits and veggies daily. She occassionally consumes fried foods. She drinks mostly soda, water, and coffee. Physical activity:  None Depression/mood screen: Negative Hearing: Intact to whispered voice Visual acuity: Grossly normal ADLs: Capable Fall risk: None Home safety: Good Cognitive evaluation: Intact to orientation, naming, recall and repetition EOL planning: No adv directives, full code/ I agree  Preventative Medicine: Encouraged her to get a flu shot in the fall. She declines tetanus, pneumovax, prevnar, zostovax or shingrix. Mammogram UTD. She declines pap smear or bone density screening. She declines colonoscopy but is agreeable to Cologuard- ordered. Encouraged her to consume a balanced diet and exercise regimen. Advised her to see an eye doctor and dentist  annually. Will check CBC, CMET, Lipid, Vit D and Hep C today.   Next appointment: 1 year, Medicare Wellness Exam   Webb Silversmith, NP

## 2017-07-08 NOTE — Assessment & Plan Note (Signed)
Currently not an issue Will monitor 

## 2017-07-08 NOTE — Patient Instructions (Signed)
Health Maintenance for Postmenopausal Women Menopause is a normal process in which your reproductive ability comes to an end. This process happens gradually over a span of months to years, usually between the ages of 22 and 9. Menopause is complete when you have missed 12 consecutive menstrual periods. It is important to talk with your health care provider about some of the most common conditions that affect postmenopausal women, such as heart disease, cancer, and bone loss (osteoporosis). Adopting a healthy lifestyle and getting preventive care can help to promote your health and wellness. Those actions can also lower your chances of developing some of these common conditions. What should I know about menopause? During menopause, you may experience a number of symptoms, such as:  Moderate-to-severe hot flashes.  Night sweats.  Decrease in sex drive.  Mood swings.  Headaches.  Tiredness.  Irritability.  Memory problems.  Insomnia.  Choosing to treat or not to treat menopausal changes is an individual decision that you make with your health care provider. What should I know about hormone replacement therapy and supplements? Hormone therapy products are effective for treating symptoms that are associated with menopause, such as hot flashes and night sweats. Hormone replacement carries certain risks, especially as you become older. If you are thinking about using estrogen or estrogen with progestin treatments, discuss the benefits and risks with your health care provider. What should I know about heart disease and stroke? Heart disease, heart attack, and stroke become more likely as you age. This may be due, in part, to the hormonal changes that your body experiences during menopause. These can affect how your body processes dietary fats, triglycerides, and cholesterol. Heart attack and stroke are both medical emergencies. There are many things that you can do to help prevent heart disease  and stroke:  Have your blood pressure checked at least every 1-2 years. High blood pressure causes heart disease and increases the risk of stroke.  If you are 53-22 years old, ask your health care provider if you should take aspirin to prevent a heart attack or a stroke.  Do not use any tobacco products, including cigarettes, chewing tobacco, or electronic cigarettes. If you need help quitting, ask your health care provider.  It is important to eat a healthy diet and maintain a healthy weight. ? Be sure to include plenty of vegetables, fruits, low-fat dairy products, and lean protein. ? Avoid eating foods that are high in solid fats, added sugars, or salt (sodium).  Get regular exercise. This is one of the most important things that you can do for your health. ? Try to exercise for at least 150 minutes each week. The type of exercise that you do should increase your heart rate and make you sweat. This is known as moderate-intensity exercise. ? Try to do strengthening exercises at least twice each week. Do these in addition to the moderate-intensity exercise.  Know your numbers.Ask your health care provider to check your cholesterol and your blood glucose. Continue to have your blood tested as directed by your health care provider.  What should I know about cancer screening? There are several types of cancer. Take the following steps to reduce your risk and to catch any cancer development as early as possible. Breast Cancer  Practice breast self-awareness. ? This means understanding how your breasts normally appear and feel. ? It also means doing regular breast self-exams. Let your health care provider know about any changes, no matter how small.  If you are 40  or older, have a clinician do a breast exam (clinical breast exam or CBE) every year. Depending on your age, family history, and medical history, it may be recommended that you also have a yearly breast X-ray (mammogram).  If you  have a family history of breast cancer, talk with your health care provider about genetic screening.  If you are at high risk for breast cancer, talk with your health care provider about having an MRI and a mammogram every year.  Breast cancer (BRCA) gene test is recommended for women who have family members with BRCA-related cancers. Results of the assessment will determine the need for genetic counseling and BRCA1 and for BRCA2 testing. BRCA-related cancers include these types: ? Breast. This occurs in males or females. ? Ovarian. ? Tubal. This may also be called fallopian tube cancer. ? Cancer of the abdominal or pelvic lining (peritoneal cancer). ? Prostate. ? Pancreatic.  Cervical, Uterine, and Ovarian Cancer Your health care provider may recommend that you be screened regularly for cancer of the pelvic organs. These include your ovaries, uterus, and vagina. This screening involves a pelvic exam, which includes checking for microscopic changes to the surface of your cervix (Pap test).  For women ages 21-65, health care providers may recommend a pelvic exam and a Pap test every three years. For women ages 79-65, they may recommend the Pap test and pelvic exam, combined with testing for human papilloma virus (HPV), every five years. Some types of HPV increase your risk of cervical cancer. Testing for HPV may also be done on women of any age who have unclear Pap test results.  Other health care providers may not recommend any screening for nonpregnant women who are considered low risk for pelvic cancer and have no symptoms. Ask your health care provider if a screening pelvic exam is right for you.  If you have had past treatment for cervical cancer or a condition that could lead to cancer, you need Pap tests and screening for cancer for at least 20 years after your treatment. If Pap tests have been discontinued for you, your risk factors (such as having a new sexual partner) need to be  reassessed to determine if you should start having screenings again. Some women have medical problems that increase the chance of getting cervical cancer. In these cases, your health care provider may recommend that you have screening and Pap tests more often.  If you have a family history of uterine cancer or ovarian cancer, talk with your health care provider about genetic screening.  If you have vaginal bleeding after reaching menopause, tell your health care provider.  There are currently no reliable tests available to screen for ovarian cancer.  Lung Cancer Lung cancer screening is recommended for adults 69-62 years old who are at high risk for lung cancer because of a history of smoking. A yearly low-dose CT scan of the lungs is recommended if you:  Currently smoke.  Have a history of at least 30 pack-years of smoking and you currently smoke or have quit within the past 15 years. A pack-year is smoking an average of one pack of cigarettes per day for one year.  Yearly screening should:  Continue until it has been 15 years since you quit.  Stop if you develop a health problem that would prevent you from having lung cancer treatment.  Colorectal Cancer  This type of cancer can be detected and can often be prevented.  Routine colorectal cancer screening usually begins at  age 42 and continues through age 45.  If you have risk factors for colon cancer, your health care provider may recommend that you be screened at an earlier age.  If you have a family history of colorectal cancer, talk with your health care provider about genetic screening.  Your health care provider may also recommend using home test kits to check for hidden blood in your stool.  A small camera at the end of a tube can be used to examine your colon directly (sigmoidoscopy or colonoscopy). This is done to check for the earliest forms of colorectal cancer.  Direct examination of the colon should be repeated every  5-10 years until age 71. However, if early forms of precancerous polyps or small growths are found or if you have a family history or genetic risk for colorectal cancer, you may need to be screened more often.  Skin Cancer  Check your skin from head to toe regularly.  Monitor any moles. Be sure to tell your health care provider: ? About any new moles or changes in moles, especially if there is a change in a mole's shape or color. ? If you have a mole that is larger than the size of a pencil eraser.  If any of your family members has a history of skin cancer, especially at a young age, talk with your health care provider about genetic screening.  Always use sunscreen. Apply sunscreen liberally and repeatedly throughout the day.  Whenever you are outside, protect yourself by wearing long sleeves, pants, a wide-brimmed hat, and sunglasses.  What should I know about osteoporosis? Osteoporosis is a condition in which bone destruction happens more quickly than new bone creation. After menopause, you may be at an increased risk for osteoporosis. To help prevent osteoporosis or the bone fractures that can happen because of osteoporosis, the following is recommended:  If you are 46-71 years old, get at least 1,000 mg of calcium and at least 600 mg of vitamin D per day.  If you are older than age 55 but younger than age 65, get at least 1,200 mg of calcium and at least 600 mg of vitamin D per day.  If you are older than age 54, get at least 1,200 mg of calcium and at least 800 mg of vitamin D per day.  Smoking and excessive alcohol intake increase the risk of osteoporosis. Eat foods that are rich in calcium and vitamin D, and do weight-bearing exercises several times each week as directed by your health care provider. What should I know about how menopause affects my mental health? Depression may occur at any age, but it is more common as you become older. Common symptoms of depression  include:  Low or sad mood.  Changes in sleep patterns.  Changes in appetite or eating patterns.  Feeling an overall lack of motivation or enjoyment of activities that you previously enjoyed.  Frequent crying spells.  Talk with your health care provider if you think that you are experiencing depression. What should I know about immunizations? It is important that you get and maintain your immunizations. These include:  Tetanus, diphtheria, and pertussis (Tdap) booster vaccine.  Influenza every year before the flu season begins.  Pneumonia vaccine.  Shingles vaccine.  Your health care provider may also recommend other immunizations. This information is not intended to replace advice given to you by your health care provider. Make sure you discuss any questions you have with your health care provider. Document Released: 03/27/2005  Document Revised: 08/23/2015 Document Reviewed: 11/06/2014 Elsevier Interactive Patient Education  2018 [employer]  

## 2017-07-08 NOTE — Assessment & Plan Note (Signed)
S/p mastectomy She chooses not to follow with oncology

## 2017-07-08 NOTE — Assessment & Plan Note (Signed)
She is refusing medication therapy today Discussed the risk of heart attack, stroke, death- she understands the risk Will check CBC and CMET today Discussed DASH diet, smoking cessation

## 2017-07-09 ENCOUNTER — Other Ambulatory Visit: Payer: Self-pay | Admitting: Internal Medicine

## 2017-07-09 ENCOUNTER — Telehealth: Payer: Self-pay

## 2017-07-09 DIAGNOSIS — E559 Vitamin D deficiency, unspecified: Secondary | ICD-10-CM

## 2017-07-09 LAB — HEPATITIS C ANTIBODY
HEP C AB: NONREACTIVE
SIGNAL TO CUT-OFF: 0.01 (ref <1.00)

## 2017-07-09 MED ORDER — VITAMIN D (ERGOCALCIFEROL) 1.25 MG (50000 UNIT) PO CAPS: 50000.0000 [IU] | ORAL_CAPSULE | ORAL | 0 refills | Status: DC

## 2017-07-09 NOTE — Telephone Encounter (Signed)
Cologuard form has been faxed w/ demographics and insurance card attached 

## 2017-07-21 ENCOUNTER — Telehealth: Payer: Self-pay | Admitting: Internal Medicine

## 2017-07-21 MED ORDER — LISINOPRIL 10 MG PO TABS: 10.0000 mg | ORAL_TABLET | Freq: Every day | ORAL | 0 refills | Status: DC

## 2017-07-21 NOTE — Telephone Encounter (Signed)
Please see 07/08/17 office note.

## 2017-07-21 NOTE — Telephone Encounter (Signed)
Lisinopril sent to [facility]. Follow up with me in 2 weeks.

## 2017-07-21 NOTE — Telephone Encounter (Signed)
Copied from [place] [id number]. Topic: Quick Communication - See Telephone Encounter >> Jul 21, 2017 10:49 AM Neva Seat wrote: Pt has rethought the suggestion R. Baity suggested pt start on BP medication.   Pt rethought it and now wants to be on the BP medication.  Please call pt back to let her know how to get the BP medication.

## 2017-07-21 NOTE — Addendum Note (Signed)
Addended by: Jearld Fenton on: 07/21/2017 02:08 PM   Modules accepted: Orders

## 2017-07-22 NOTE — Telephone Encounter (Signed)
Left detailed msg on VM per HIPAA  

## 2017-08-08 ENCOUNTER — Other Ambulatory Visit: Payer: Self-pay | Admitting: Internal Medicine

## 2017-08-08 DIAGNOSIS — E559 Vitamin D deficiency, unspecified: Secondary | ICD-10-CM

## 2017-08-09 LAB — COLOGUARD: Cologuard: NEGATIVE

## 2017-08-14 ENCOUNTER — Other Ambulatory Visit: Payer: Self-pay | Admitting: Internal Medicine

## 2017-08-23 ENCOUNTER — Encounter: Payer: Self-pay | Admitting: Internal Medicine

## 2017-08-23 ENCOUNTER — Ambulatory Visit (INDEPENDENT_AMBULATORY_CARE_PROVIDER_SITE_OTHER): Payer: Medicare Other | Admitting: Internal Medicine

## 2017-08-23 VITALS — BP 126/84 | HR 49 | Temp 97.7°F | Wt 111.0 lb

## 2017-08-23 DIAGNOSIS — I10 Essential (primary) hypertension: Secondary | ICD-10-CM

## 2017-08-23 DIAGNOSIS — R001 Bradycardia, unspecified: Secondary | ICD-10-CM | POA: Diagnosis not present

## 2017-08-23 MED ORDER — LISINOPRIL 10 MG PO TABS
10.0000 mg | ORAL_TABLET | Freq: Every day | ORAL | 1 refills | Status: DC
Start: 2017-08-23 — End: 2021-06-04

## 2017-08-23 NOTE — Patient Instructions (Signed)

## 2017-08-23 NOTE — Assessment & Plan Note (Signed)
Improved on Lisinopril, refilled today Indication for ECG: HTN, bradycardia Interpretation: left atrial enlargement, no acute findings Reinforced DASH diet, smoking cessation

## 2017-08-23 NOTE — Progress Notes (Signed)
Subjective:    Patient ID: Veronica Hartman, female    DOB: [birth year], 72 y.o.   MRN: [id number]  HPI  Pt presents to the clinic today with c/o elevated blood pressures. Her last BP was 140/90. At that point, she wanted to hold off on medication and try to lower BP with diet and exercise. She called back later and decided to go ahead and start Lisinopril 10 mg daily. She has been taking the medication as prescribed. She denies adverse side effects. Her BP today is 126/84. There is no ECG on file.  Review of Systems      Past Medical History:  Diagnosis Date  . Alcohol abuse    in past and went through rehab and AA  . Allergy   . Anxiety   . Cancer [facility]) 1995   breast left  . Depression    in the past but not currently  . Pneumonia    "has not had pneumonia in the last 13 years"  . PONV (postoperative nausea and vomiting)     Current Outpatient Medications  Medication Sig Dispense Refill  . lisinopril (PRINIVIL,ZESTRIL) 10 MG tablet TAKE 1 TABLET BY MOUTH EVERY DAY 30 tablet 0  . Vitamin D, Ergocalciferol, (DRISDOL) 50000 units CAPS capsule Take 1 capsule (50,000 Units total) by mouth every 7 (seven) days. 12 capsule 0   No current facility-administered medications for this visit.     Allergies  Allergen Reactions  . Sulfa Antibiotics     Childhood allergy    Family History  Problem Relation Age of Onset  . Depression Mother   . Hyperlipidemia Father   . Heart disease Maternal Grandfather     Social History   Socioeconomic History  . Marital status: Divorced    Spouse name: Not on file  . Number of children: Not on file  . Years of education: Not on file  . Highest education level: Not on file  Occupational History  . Not on file  Social Needs  . Financial resource strain: Not on file  . Food insecurity:    Worry: Not on file    Inability: Not on file  . Transportation needs:    Medical: Not on file    Non-medical: Not on file  Tobacco Use  .  Smoking status: Current Every Day Smoker    Packs/day: 1.00    Years: 51.00    Pack years: 51.00  . Smokeless tobacco: Never Used  Substance and Sexual Activity  . Alcohol use: Yes    Alcohol/week: 12.6 oz    Types: 21 Cans of beer per week    Comment: 2018 " i ONLY DRINK A BEER OR 2 A MONTH "  . Drug use: No  . Sexual activity: Not Currently  Lifestyle  . Physical activity:    Days per week: Not on file    Minutes per session: Not on file  . Stress: Not on file  Relationships  . Social connections:    Talks on phone: Not on file    Gets together: Not on file    Attends religious service: Not on file    Active member of club or organization: Not on file    Attends meetings of clubs or organizations: Not on file    Relationship status: Not on file  . Intimate partner violence:    Fear of current or ex partner: Not on file    Emotionally abused: Not on file    Physically  abused: Not on file    Forced sexual activity: Not on file  Other Topics Concern  . Not on file  Social History Narrative  . Not on file     Constitutional: Denies fever, malaise, fatigue, headache or abrupt weight changes.  Respiratory: Denies difficulty breathing, shortness of breath, cough or sputum production.   Cardiovascular: Denies chest pain, chest tightness, palpitations or swelling in the hands or feet.  Neurological: Denies dizziness, difficulty with memory, difficulty with speech or problems with balance and coordination.    No other specific complaints in a complete review of systems (except as listed in HPI above).  Objective:   Physical Exam  BP 126/84   Pulse (!) 49   Temp 97.7 F (36.5 C) (Oral)   Wt 111 lb (50.3 kg)   SpO2 98%   BMI 19.66 kg/m  Wt Readings from Last 3 Encounters:  08/23/17 111 lb (50.3 kg)  07/08/17 113 lb (51.3 kg)  05/31/17 115 lb 8 oz (52.4 kg)    General: Appears her stated age, in NAD. Cardiovascular: Normal rate and rhythm. S1,S2 noted. Occasional  ectopic beat noted. No murmur, rubs or gallops noted. No JVD or BLE edema.  Pulmonary/Chest: Normal effort and positive vesicular breath sounds. No respiratory distress. No wheezes, rales or ronchi noted. .  Neurological: Alert and oriented.  BMET    Component Value Date/Time   NA 141 07/08/2017 0948   K 4.6 07/08/2017 0948   CL 104 07/08/2017 0948   CO2 28 07/08/2017 0948   GLUCOSE 89 07/08/2017 0948   BUN 16 07/08/2017 0948   CREATININE 1.04 07/08/2017 0948   CALCIUM 10.0 07/08/2017 0948   GFRNONAA 46 (L) 08/20/2016 1418   GFRAA 53 (L) 08/20/2016 1418    Lipid Panel     Component Value Date/Time   CHOL 196 07/08/2017 0948   TRIG 79.0 07/08/2017 0948   HDL 68.20 07/08/2017 0948   CHOLHDL 3 07/08/2017 0948   VLDL 15.8 07/08/2017 0948   LDLCALC 112 (H) 07/08/2017 0948    CBC    Component Value Date/Time   WBC 8.0 07/08/2017 0948   RBC 4.81 07/08/2017 0948   HGB 15.0 07/08/2017 0948   HCT 45.4 07/08/2017 0948   PLT 242.0 07/08/2017 0948   MCV 94.3 07/08/2017 0948   MCH 31.0 08/20/2016 1418   MCHC 33.1 07/08/2017 0948   RDW 14.0 07/08/2017 0948    Hgb A1C No results found for: HGBA1C         Assessment & Plan:

## 2017-08-27 ENCOUNTER — Telehealth: Payer: Self-pay

## 2017-08-27 NOTE — Telephone Encounter (Signed)
Result has been abstracted and result letter mailed to pt

## 2017-09-04 ENCOUNTER — Other Ambulatory Visit: Payer: Self-pay | Admitting: Internal Medicine

## 2017-09-04 DIAGNOSIS — E559 Vitamin D deficiency, unspecified: Secondary | ICD-10-CM

## 2017-10-04 ENCOUNTER — Other Ambulatory Visit: Payer: Self-pay | Admitting: Internal Medicine

## 2017-10-04 DIAGNOSIS — E559 Vitamin D deficiency, unspecified: Secondary | ICD-10-CM

## 2017-10-08 ENCOUNTER — Ambulatory Visit (INDEPENDENT_AMBULATORY_CARE_PROVIDER_SITE_OTHER): Payer: Medicare Other | Admitting: Family Medicine

## 2017-10-08 ENCOUNTER — Encounter: Payer: Self-pay | Admitting: Family Medicine

## 2017-10-08 DIAGNOSIS — E559 Vitamin D deficiency, unspecified: Secondary | ICD-10-CM | POA: Diagnosis not present

## 2017-10-08 DIAGNOSIS — K0889 Other specified disorders of teeth and supporting structures: Secondary | ICD-10-CM | POA: Diagnosis not present

## 2017-10-08 MED ORDER — AMOXICILLIN-POT CLAVULANATE 875-125 MG PO TABS: 1.0000 | ORAL_TABLET | Freq: Two times a day (BID) | ORAL | 0 refills | Status: DC

## 2017-10-08 NOTE — Progress Notes (Signed)
R facial swelling and dental pain, R lower side.  H/o poor dentition.    This episode started 4 days ago.  Taking advil, 2 pills every 4 hours.  Some relief with that but if she skips a dose in the middle in the night she wakes up in pain. No trauma.  Safe at home.  No L sided pain.    No fevers.  No vomiting.  Some ear stuffiness.  No maxillary facial pain but R mandible ttp.    Meds, vitals, and allergies reviewed.   ROS: Per HPI unless specifically indicated in ROS section   GEN: nad, alert and oriented HEENT: mucous membranes moist NECK: supple w/o LA TM wnl B Nasal exam wnl R distal jaw swelling locally.   Poor dentition.  Salivary glands not ttp  No rash. Gumline with no focal abscess noted but she does have cracked teeth. No stridor.

## 2017-10-08 NOTE — Patient Instructions (Signed)
Start the antibiotics, take advil with food and call the dental clinic.  Take care.  Glad to see you.

## 2017-10-08 NOTE — Addendum Note (Signed)
Addended by: Ellamae Sia on: 10/08/2017 03:45 PM   Modules accepted: Orders

## 2017-10-09 LAB — VITAMIN D 25 HYDROXY (VIT D DEFICIENCY, FRACTURES): Vit D, 25-Hydroxy: 43 ng/mL (ref 30–100)

## 2017-10-10 DIAGNOSIS — K0889 Other specified disorders of teeth and supporting structures: Secondary | ICD-10-CM | POA: Insufficient documentation

## 2017-10-10 NOTE — Assessment & Plan Note (Signed)
Discussed with patient about options.  Start Augmentin.  Advised her to follow-up with the dental clinic.  She agrees.  At this point still okay for outpatient follow-up.  She had pre-existing order for follow-up vitamin D per PCP.  I will defer.

## 2017-10-11 ENCOUNTER — Other Ambulatory Visit: Payer: Medicare Other

## 2017-11-06 IMAGING — MG 2D DIGITAL DIAGNOSTIC UNILATERAL RIGHT MAMMOGRAM WITH CAD AND AD
8 of 11 series · 8 of 23 positions shown · non-contrast
Comparison: None.

CLINICAL DATA: 70-year-old with personal history of left breast
cancer and remote left mastectomy, presenting now with a palpable
lump in the right breast which she states she noticed initially
several months ago. The patient states that her last mammogram was
in the late 1990s.

EXAM:
2D DIGITAL DIAGNOSTIC RIGHT MAMMOGRAM WITH CAD AND ADJUNCT TOMO
ULTRASOUND RIGHT BREAST

[R ML]
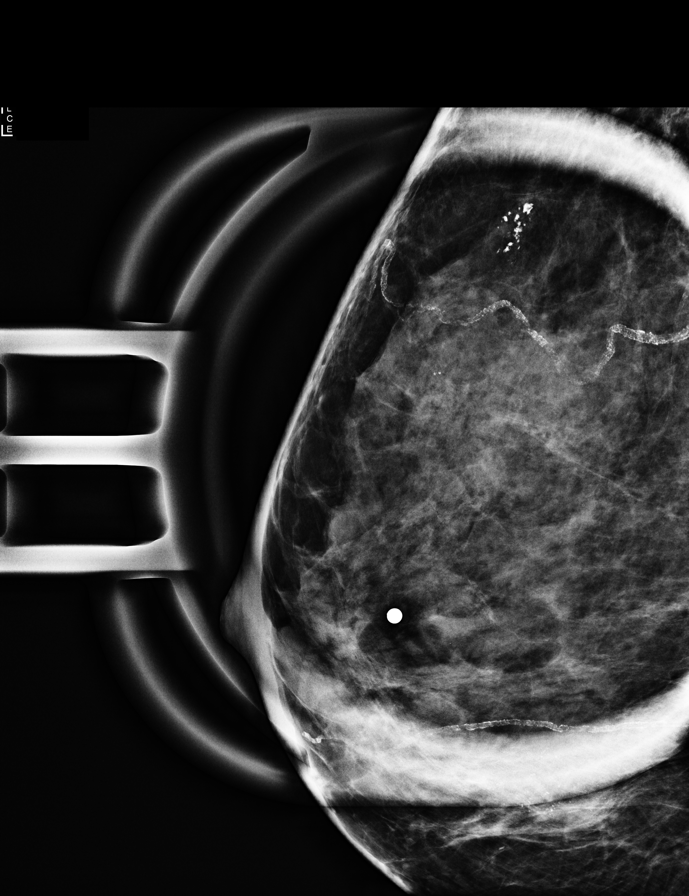

[R CC (1 of 2)]
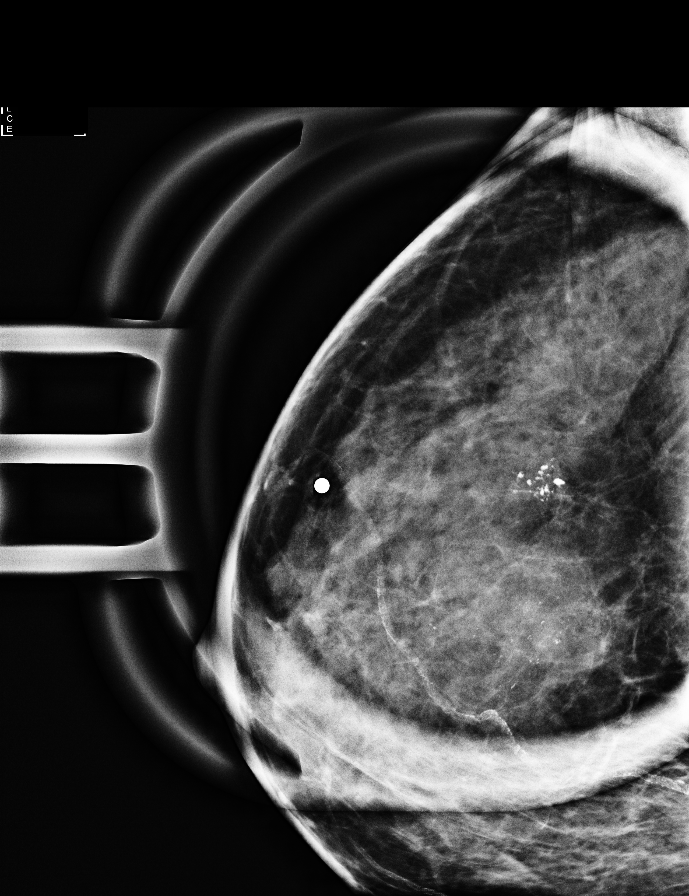

[R CC synth-2D]
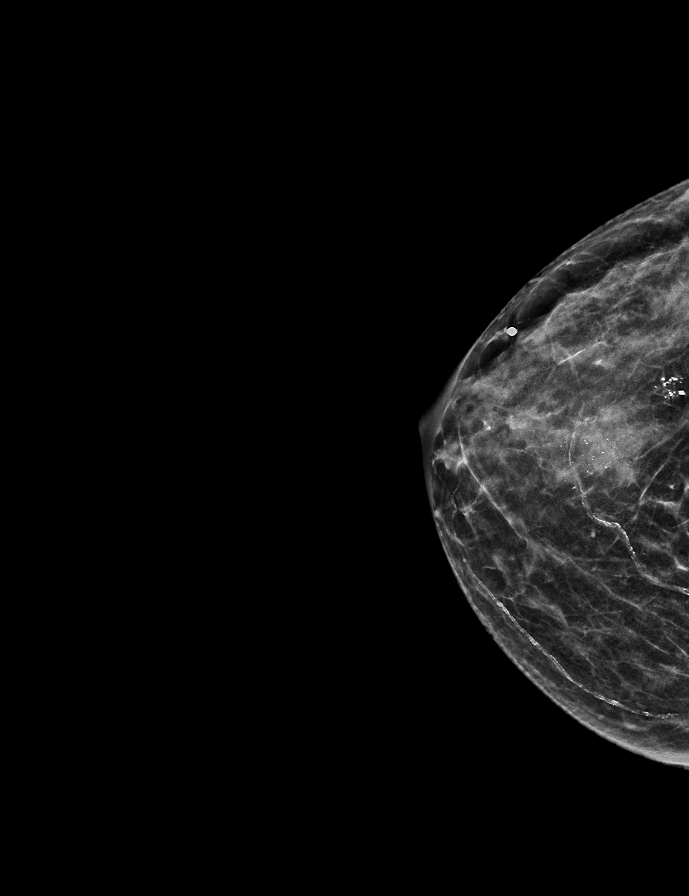

[R TAN synth-2D]
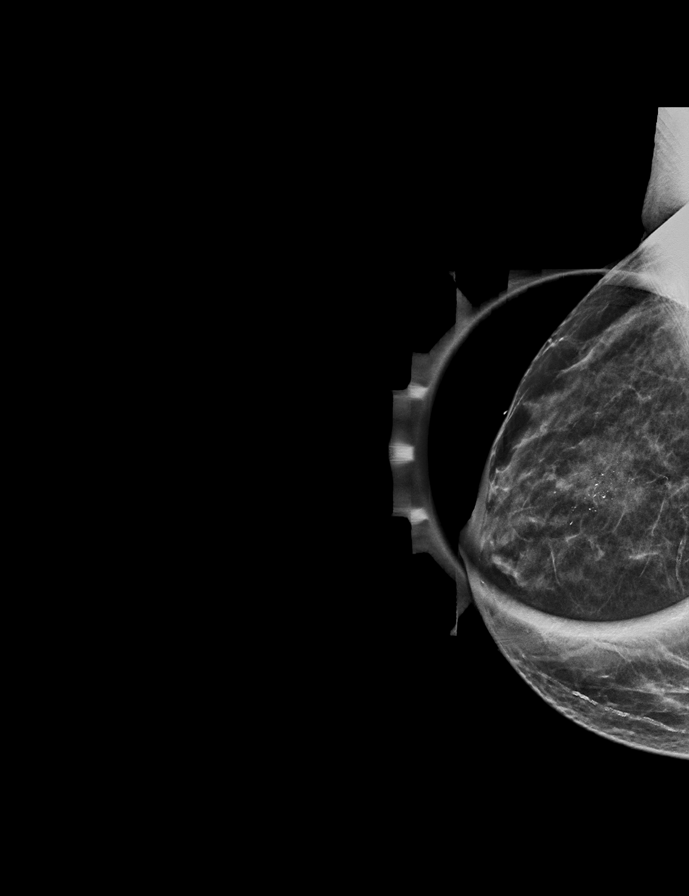

[R MLO]
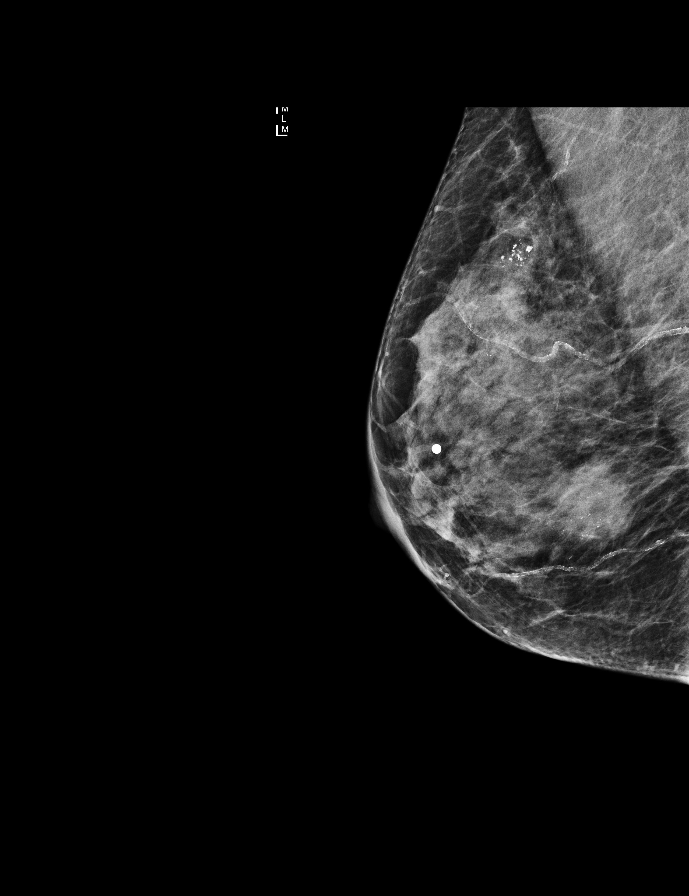

[R CC (2 of 2)]
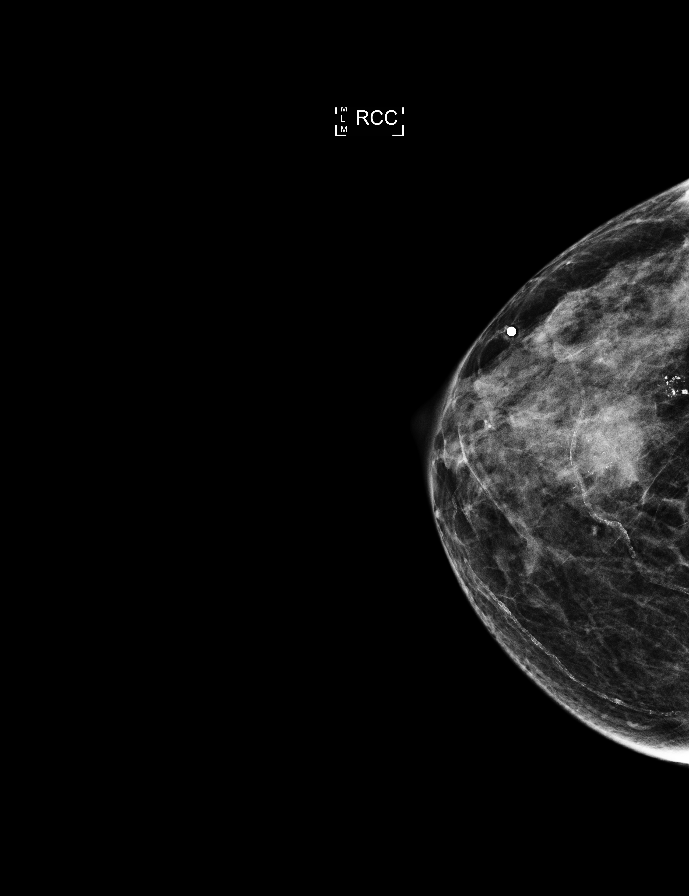

[R MLO synth-2D]
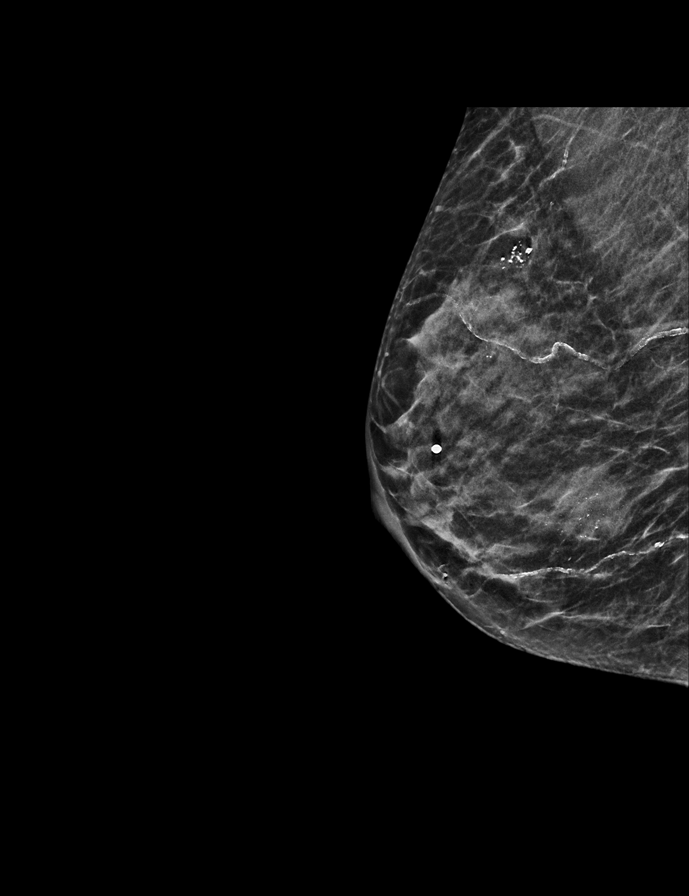

[R TAN]
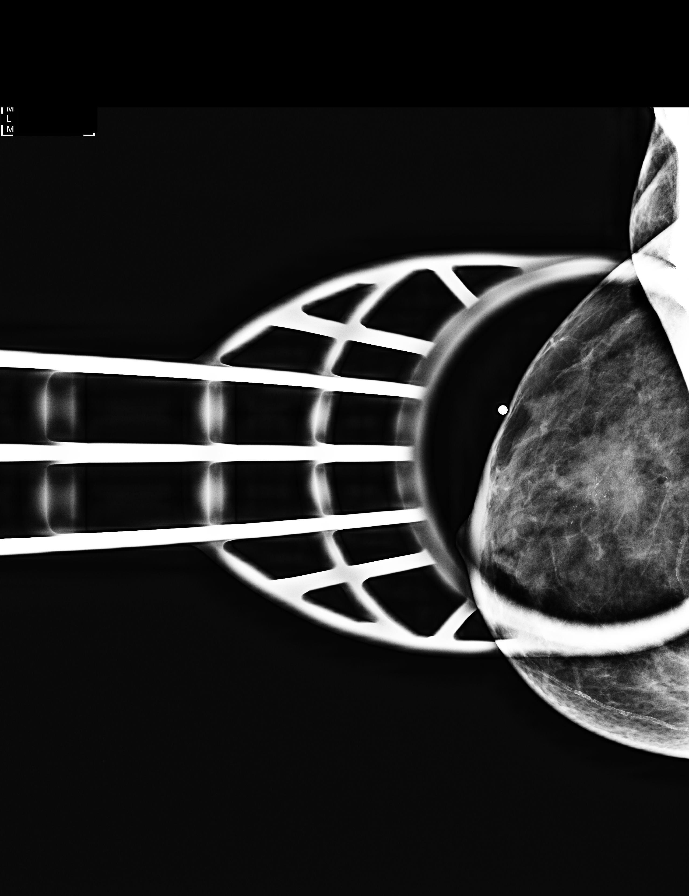

[8 of 23 positions shown; findings below may reference images not displayed]

ACR Breast Density Category c: The breast tissue is heterogeneously
dense, which may obscure small masses.
FINDINGS: Standard 2D and tomosynthesis full field CC and MLO views of the
right breast were obtained. A standard and tomosynthesis spot
tangential view of the area of palpable concern in the right breast
and standard spot magnification CC and mediolateral views of right
breast calcifications were obtained.

Corresponding to the area of palpable concern is a mass with
irregular margins measuring approximately 2.5 cm, associated with
suspicious microcalcifications.

Spot magnification images confirm a group of coarse heterogeneous
calcifications in the upper outer quadrant which span approximately
9 mm, without an associated mass or architectural distortion. These
calcifications are approximately 5 cm superior to the mass.

Mammographic images were processed with CAD.

On physical exam, there is a firm fixed palpable approximate 2 cm
mass in the lower outer quadrant of the right breast. There is
associated mild skin retraction when the patient raises the right
arm.

Targeted right breast ultrasound is performed, showing a hypoechoic
mass with irregular margins at the 7 o'clock position approximately
3 cm from the nipple measuring approximately 1.3 x 2.1 x 2.0 cm,
containing microcalcifications, with dermal skin involvement,
demonstrating mixed posterior characteristics and demonstrating
internal power Doppler flow, corresponding to the area of palpable
concern.

Sonographic evaluation of the right axilla demonstrates no
pathologic lymphadenopathy.
IMPRESSION: 1. Highly suspicious 2.1 cm mass involving the lower outer quadrant
of the right breast with associated microcalcifications and dermal
skin involvement.
2. Indeterminate 9 mm group of coarse heterogeneous calcifications
in the upper outer quadrant of the right breast. These
calcifications are approximately 5 cm superior to the suspicious
mass.
3. No pathologic right axillary lymphadenopathy.

RECOMMENDATION:
1. Ultrasound-guided core needle biopsy of the highly suspicious
palpable right breast mass.
2. Stereotactic tomosynthesis guided core needle biopsy of the
indeterminate right breast calcifications.
The needle biopsy procedures were discussed with the patient and her
questions were answered. She has agreed to proceed and both biopsies
have been scheduled for [REDACTED], [DATE].

I have discussed the findings and recommendations with the patient.
Results were also provided in writing at the conclusion of the
visit. If applicable, a reminder letter will be sent to the patient
regarding the next appointment.

BI-RADS CATEGORY  5: Highly suggestive of malignancy.

## 2018-01-25 ENCOUNTER — Other Ambulatory Visit: Payer: Self-pay

## 2018-01-25 NOTE — Patient Outreach (Signed)
[facility]) Care Management  01/25/2018  JULIYA MAGILL 05/21/1945 [id number]   Medication Adherence call to Mrs. Wyman Songster patient telephone number belongs to someone else under [place] has a number but is disconnected, patient is showing past due on Lisinopril 10 mg. Mrs. Lough is showing past due under [place].   [facility] Management Direct Dial [phone]  Fax [phone] [email or web address]

## 2019-03-16 ENCOUNTER — Ambulatory Visit: Payer: Medicare Other

## 2019-03-25 ENCOUNTER — Ambulatory Visit: Payer: Medicare Other | Attending: Internal Medicine

## 2019-03-25 DIAGNOSIS — Z23 Encounter for immunization: Secondary | ICD-10-CM

## 2019-03-25 NOTE — Progress Notes (Signed)
   Covid-19 Vaccination Clinic  Name:  Veronica Hartman    MRN: [id number] DOB: [birth year]  03/25/2019  Veronica Hartman was observed post Covid-19 immunization for 15 minutes without incidence. She was provided with Vaccine Information Sheet and instruction to access the V-Safe system.   Veronica Hartman was instructed to call 911 with any severe reactions post vaccine: Marland Kitchen Difficulty breathing  . Swelling of your face and throat  . A fast heartbeat  . A bad rash all over your body  . Dizziness and weakness    Immunizations Administered    Name Date Dose VIS Date Route   Pfizer COVID-19 Vaccine 03/25/2019  8:44 AM 0.3 mL 01/27/2019 Intramuscular   Manufacturer: [place]   Lot: [id number]   [place]: [id number]

## 2019-04-06 ENCOUNTER — Ambulatory Visit: Payer: Medicare Other

## 2019-04-18 ENCOUNTER — Ambulatory Visit: Payer: Medicare Other | Attending: Internal Medicine

## 2019-04-18 ENCOUNTER — Ambulatory Visit: Payer: Medicare Other

## 2019-04-18 DIAGNOSIS — Z23 Encounter for immunization: Secondary | ICD-10-CM

## 2019-04-18 NOTE — Progress Notes (Signed)
   Covid-19 Vaccination Clinic  Name:  Veronica Hartman    MRN: [id number] DOB: [birth year]  04/18/2019  [name] was observed post Covid-19 immunization for 15 minutes without incident. She was provided with Vaccine Information Sheet and instruction to access the V-Safe system.   [name] was instructed to call 911 with any severe reactions post vaccine: Marland Kitchen Difficulty breathing  . Swelling of face and throat  . A fast heartbeat  . A bad rash all over body  . Dizziness and weakness   Immunizations Administered    Name Date Dose VIS Date Route   Pfizer COVID-19 Vaccine 04/18/2019  9:35 AM 0.3 mL 01/27/2019 Intramuscular   Manufacturer: [place]   Lot: [id number]   [place]: [id number]

## 2019-05-04 DIAGNOSIS — H25813 Combined forms of age-related cataract, bilateral: Secondary | ICD-10-CM | POA: Diagnosis not present

## 2019-05-04 DIAGNOSIS — H3509 Other intraretinal microvascular abnormalities: Secondary | ICD-10-CM | POA: Diagnosis not present

## 2019-05-29 DIAGNOSIS — H25811 Combined forms of age-related cataract, right eye: Secondary | ICD-10-CM | POA: Diagnosis not present

## 2019-05-29 DIAGNOSIS — Z01818 Encounter for other preprocedural examination: Secondary | ICD-10-CM | POA: Diagnosis not present

## 2019-05-29 DIAGNOSIS — H2511 Age-related nuclear cataract, right eye: Secondary | ICD-10-CM | POA: Diagnosis not present

## 2019-06-12 DIAGNOSIS — H2512 Age-related nuclear cataract, left eye: Secondary | ICD-10-CM | POA: Diagnosis not present

## 2019-06-12 DIAGNOSIS — H25812 Combined forms of age-related cataract, left eye: Secondary | ICD-10-CM | POA: Diagnosis not present

## 2020-07-23 ENCOUNTER — Encounter: Payer: Medicare Other | Admitting: Adult Health

## 2021-06-04 ENCOUNTER — Encounter: Payer: Self-pay | Admitting: Nurse Practitioner

## 2021-06-04 ENCOUNTER — Ambulatory Visit: Payer: Medicare Other | Admitting: Nurse Practitioner

## 2021-06-04 VITALS — BP 138/86 | HR 106 | Temp 97.3°F | Resp 14 | Ht 62.25 in | Wt 105.6 lb

## 2021-06-04 DIAGNOSIS — Z1211 Encounter for screening for malignant neoplasm of colon: Secondary | ICD-10-CM

## 2021-06-04 DIAGNOSIS — F32A Depression, unspecified: Secondary | ICD-10-CM

## 2021-06-04 DIAGNOSIS — F419 Anxiety disorder, unspecified: Secondary | ICD-10-CM

## 2021-06-04 DIAGNOSIS — R202 Paresthesia of skin: Secondary | ICD-10-CM | POA: Diagnosis not present

## 2021-06-04 DIAGNOSIS — Z7689 Persons encountering health services in other specified circumstances: Secondary | ICD-10-CM | POA: Diagnosis not present

## 2021-06-04 DIAGNOSIS — I10 Essential (primary) hypertension: Secondary | ICD-10-CM | POA: Diagnosis not present

## 2021-06-04 DIAGNOSIS — C50511 Malignant neoplasm of lower-outer quadrant of right female breast: Secondary | ICD-10-CM

## 2021-06-04 DIAGNOSIS — Z13820 Encounter for screening for osteoporosis: Secondary | ICD-10-CM | POA: Diagnosis not present

## 2021-06-04 DIAGNOSIS — Z122 Encounter for screening for malignant neoplasm of respiratory organs: Secondary | ICD-10-CM

## 2021-06-04 DIAGNOSIS — Z0000 Encounter for general adult medical examination without abnormal findings: Secondary | ICD-10-CM | POA: Insufficient documentation

## 2021-06-04 NOTE — Patient Instructions (Signed)
Nice to see you today ?I will be in touch with the lab results once I have them ?Follow up with me in 6 months, sooner if needed ?

## 2021-06-04 NOTE — Assessment & Plan Note (Signed)
Patient is here for transfer care but has been 3 years since she has been evaluated.  Establish care with a new provider ?

## 2021-06-04 NOTE — Progress Notes (Signed)
[facility] for lung cancer ? ? ?New Patient Office Visit ? ?Subjective   ? ?Patient ID: Veronica Hartman, female    DOB: [birth year]  Age: 76 y.o. MRN: [id number] ? ?CC:  ?Chief Complaint  ?Patient presents with  ? Establish Care  ? ? ?HPI ?Veronica Hartman presents to establish care ? ?HTN: not currenlty on medication. States that she has not been on the medication prior to covid. States that she cannot check blood pressure medicatoins ? ? ?Anxiety and depression: never had any medications for it.  Denies HI/SI/AVH. ? ?Hx of breast cancer: States that she has had it in 58s and then again at 76 years of age. States that she did not want to go to oncolgy for being cleared from Psychologist, sport and exercise.  Been 5 years.  Offered patient to consult with oncologist she politely declined ? ? ? ?Immunizations: ?-Tetanus: Needs updating, at pharmacy ?-Influenza: has taken them in the past ?-Covid-19: pfizerx2 and 2 booster ?-Shingles: information, at pharmacy ?-Pneumonia: unsure ? ?-HPV: Aged out ? ?Diet: Fair diet. 2 meals a day. Healthy choices. Water, coffee ?Exercise: No regular exercise. Works with large animals ? ?Eye exam: Completes annually. Needs updating. Cataract sugery 2-3 years ago bilateral  ?Dental exam: Completes semi-annually. Recently had 17 teeth removed approx 2 months of ago  ? ?Pap Smear: Aged out ?Mammogram: Aged out ?Colonoscopy:  at 3 and then a cologuard. Last 23 years ?Dexa: order at [place] ? ?Lung Cancer Screening:  order today ? ?Sleep: States she will watch the news and sometines goes to sleep and then will wake up around 10p. Sometimes she will get up to 8 hours. Nocturia x1 ? ?Outpatient Encounter Medications as of 06/04/2021  ?Medication Sig  ? [DISCONTINUED] amoxicillin-clavulanate (AUGMENTIN) 875-125 MG tablet Take 1 tablet by mouth 2 (two) times daily.  ? [DISCONTINUED] lisinopril (PRINIVIL,ZESTRIL) 10 MG tablet Take 1 tablet (10 mg total) by mouth daily.  ? [DISCONTINUED] Vitamin D, Ergocalciferol,  (DRISDOL) 50000 units CAPS capsule Take 1 capsule (50,000 Units total) by mouth every 7 (seven) days.  ? ?No facility-administered encounter medications on file as of 06/04/2021.  ? ? ?Past Medical History:  ?Diagnosis Date  ? Alcohol abuse   ? in past and went through rehab and AA  ? Allergy   ? Anxiety   ? Cancer [facility]) 1995  ? breast left  ? Depression   ? in the past but not currently  ? Pneumonia   ? "has not had pneumonia in the last 13 years"  ? PONV (postoperative nausea and vomiting)   ? ? ?Past Surgical History:  ?Procedure Laterality Date  ? BREAST SURGERY    ? biopsy 1995  ? MASTECTOMY Left 1995  ? MASTECTOMY W/ SENTINEL NODE BIOPSY Right 08/24/2016  ? MASTECTOMY WITH AXILLARY LYMPH NODE DISSECTION Right 08/24/2016  ? Procedure: MASTECTOMY RIGHT WITH AXILLARY SENTINEL NODE BIOPSY;  Surgeon: Alphonsa Overall, MD;  Location: [facility];  Service: General;  Laterality: Right;  ? ? ?Family History  ?Problem Relation Age of Onset  ? Depression Mother   ? Hyperlipidemia Father   ? Alzheimer's disease Father   ? Heart disease Maternal Grandfather   ? ? ?Social History  ? ?Socioeconomic History  ? Marital status: Divorced  ?  Spouse name: Not on file  ? Number of children: Not on file  ? Years of education: Not on file  ? Highest education level: Not on file  ?Occupational History  ? Not on file  ?Tobacco  Use  ? Smoking status: Every Day  ?  Packs/day: 1.00  ?  Years: 55.00  ?  Pack years: 55.00  ?  Types: Cigarettes  ? Smokeless tobacco: Never  ?Vaping Use  ? Vaping Use: Never used  ?Substance and Sexual Activity  ? Alcohol use: Not Currently  ? Drug use: No  ? Sexual activity: Not Currently  ?Other Topics Concern  ? Not on file  ?Social History Narrative  ? Not on file  ? ?Social Determinants of Health  ? ?Financial Resource Strain: Not on file  ?Food Insecurity: Not on file  ?Transportation Needs: Not on file  ?Physical Activity: Not on file  ?Stress: Not on file  ?Social Connections: Not on file  ?Intimate Partner  Violence: Not on file  ? ? ?Review of Systems  ?Constitutional:  Negative for chills and fever.  ?Respiratory:  Positive for cough (loose productive. Hasnt looked at it). Negative for shortness of breath (DOE with carrying quilts up the stairs).   ?Cardiovascular:  Negative for chest pain and leg swelling.  ?Gastrointestinal:  Negative for constipation, diarrhea and vomiting.  ?     BM daily ?  ?Genitourinary:  Negative for dysuria and hematuria.  ?Neurological:  Positive for tingling (bilateral feet for an extrended period of time at night time). Negative for dizziness, weakness and headaches.  ?Psychiatric/Behavioral:  Negative for hallucinations and suicidal ideas.   ? ?  ? ? ?Objective   ? ?BP 138/86   Pulse (!) 106   Temp (!) 97.3 ?F (36.3 ?C)   Resp 14   Ht 5' 2.25" (1.581 m)   Wt 105 lb 9 oz (47.9 kg)   SpO2 98%   BMI 19.15 kg/m?  ? ?Physical Exam ?Vitals and nursing note reviewed.  ?Constitutional:   ?   Appearance: Normal appearance.  ?HENT:  ?   Right Ear: Tympanic membrane, ear canal and external ear normal.  ?   Left Ear: Ear canal and external ear normal. There is impacted cerumen.  ?   Mouth/Throat:  ?   Mouth: Mucous membranes are moist.  ?   Pharynx: Oropharynx is clear.  ?Eyes:  ?   Extraocular Movements: Extraocular movements intact.  ?   Pupils: Pupils are equal, round, and reactive to light.  ?   Comments: Wears corrective lenses ?  ?Cardiovascular:  ?   Rate and Rhythm: Normal rate and regular rhythm.  ?   Pulses: Normal pulses.  ?   Heart sounds: Normal heart sounds.  ?Pulmonary:  ?   Effort: Pulmonary effort is normal.  ?   Breath sounds: Normal breath sounds.  ?Abdominal:  ?   General: Bowel sounds are normal. There is no distension.  ?   Palpations: There is no mass.  ?   Tenderness: There is no abdominal tenderness.  ?   Hernia: No hernia is present.  ?Musculoskeletal:  ?   Right lower leg: No edema.  ?   Left lower leg: No edema.  ?Lymphadenopathy:  ?   Cervical: No cervical  adenopathy.  ?Skin: ?   General: Skin is warm.  ?Neurological:  ?   Mental Status: She is alert.  ?   Deep Tendon Reflexes:  ?   Reflex Scores: ?     Bicep reflexes are 2+ on the right side and 2+ on the left side. ?     Patellar reflexes are 2+ on the right side and 2+ on the left side. ?   Comments:  Bilateral upper and lower extremity strength 5/5  ?Psychiatric:     ?   Mood and Affect: Mood normal.     ?   Behavior: Behavior normal.     ?   Thought Content: Thought content normal.     ?   Judgment: Judgment normal.  ? ? ? ?  ? ?Assessment & Plan:  ? ?Problem List Items Addressed This Visit   ? ?  ? Cardiovascular and Mediastinum  ? HTN (hypertension)  ?  Currently not maintained on any medication.  Is been on medication for couple years states since prior to [place].  Blood pressure within normal limits today.  We will continue managing it with diet and lifestyle modifications at this juncture ? ?  ?  ? Relevant Orders  ? CBC with Differential/Platelet  ? Comprehensive metabolic panel  ? TSH  ? Lipid panel  ?  ? Other  ? Anxiety and depression  ?  Historical diagnosis that did not require medication per patient report.  Did administer PHQ-9 and GAD-7 in office.  Patient denies HI/SI/AVH. ? ?  ?  ? Breast cancer of lower-outer quadrant of right female breast ([place])  ?  History of breast cancer in both breasts with resultant mastectomies 40 years apart.  Has been cleared by her surgeon but has not been followed by oncology.  Did offer referral to oncologist patient politely declined ? ?  ?  ? Establishing care with new doctor, encounter for - Primary  ?  Patient is here for transfer care but has been 3 years since she has been evaluated.  Establish care with a new provider ? ?  ?  ? Relevant Orders  ? CBC with Differential/Platelet  ? Comprehensive metabolic panel  ? TSH  ? Lipid panel  ? Paresthesias  ?  Patient mentions is tingling to bilateral feet when she goes to sleep at night.  Pending labs ? ?  ?  ? Relevant  Orders  ? Vitamin B12  ? ?Other Visit Diagnoses   ? ? Screening for colon cancer      ? Relevant Orders  ? Cologuard  ? Screening for osteoporosis      ? Relevant Orders  ? DG Bone Density  ? Screening for lung cancer      ? Rel

## 2021-06-04 NOTE — Assessment & Plan Note (Signed)
Patient mentions is tingling to bilateral feet when she goes to sleep at night.  Pending labs ?

## 2021-06-04 NOTE — Assessment & Plan Note (Signed)
History of breast cancer in both breasts with resultant mastectomies 40 years apart.  Has been cleared by her surgeon but has not been followed by oncology.  Did offer referral to oncologist patient politely declined ?

## 2021-06-04 NOTE — Assessment & Plan Note (Signed)
Historical diagnosis that did not require medication per patient report.  Did administer PHQ-9 and GAD-7 in office.  Patient denies HI/SI/AVH. ?

## 2021-06-04 NOTE — Assessment & Plan Note (Signed)
Currently not maintained on any medication.  Is been on medication for couple years states since prior to [place].  Blood pressure within normal limits today.  We will continue managing it with diet and lifestyle modifications at this juncture ?

## 2021-06-05 LAB — CBC WITH DIFFERENTIAL/PLATELET
Basophils Absolute: 0.1 10*3/uL (ref 0.0–0.1)
Basophils Relative: 0.9 % (ref 0.0–3.0)
Eosinophils Absolute: 0.1 10*3/uL (ref 0.0–0.7)
Eosinophils Relative: 0.8 % (ref 0.0–5.0)
HCT: 42.2 % (ref 36.0–46.0)
Hemoglobin: 13.8 g/dL (ref 12.0–15.0)
Lymphocytes Relative: 29.2 % (ref 12.0–46.0)
Lymphs Abs: 2.9 10*3/uL (ref 0.7–4.0)
MCHC: 32.7 g/dL (ref 30.0–36.0)
MCV: 91.9 fl (ref 78.0–100.0)
Monocytes Absolute: 0.9 10*3/uL (ref 0.1–1.0)
Monocytes Relative: 9.3 % (ref 3.0–12.0)
Neutro Abs: 5.9 10*3/uL (ref 1.4–7.7)
Neutrophils Relative %: 59.8 % (ref 43.0–77.0)
Platelets: 256 10*3/uL (ref 150.0–400.0)
RBC: 4.59 Mil/uL (ref 3.87–5.11)
RDW: 14.3 % (ref 11.5–15.5)
WBC: 9.9 10*3/uL (ref 4.0–10.5)

## 2021-06-05 LAB — TSH: TSH: 1.61 u[IU]/mL (ref 0.35–5.50)

## 2021-06-05 LAB — COMPREHENSIVE METABOLIC PANEL
ALT: 9 U/L (ref 0–35)
AST: 17 U/L (ref 0–37)
Albumin: 4.3 g/dL (ref 3.5–5.2)
Alkaline Phosphatase: 78 U/L (ref 39–117)
BUN: 14 mg/dL (ref 6–23)
CO2: 30 mEq/L (ref 19–32)
Calcium: 9.2 mg/dL (ref 8.4–10.5)
Chloride: 103 mEq/L (ref 96–112)
Creatinine, Ser: 1.19 mg/dL (ref 0.40–1.20)
GFR: 44.46 mL/min — ABNORMAL LOW (ref >60.00)
Glucose, Bld: 96 mg/dL (ref 70–99)
Potassium: 4.6 mEq/L (ref 3.5–5.1)
Sodium: 140 mEq/L (ref 135–145)
Total Bilirubin: 0.4 mg/dL (ref 0.2–1.2)
Total Protein: 7 g/dL (ref 6.0–8.3)

## 2021-06-05 LAB — LIPID PANEL
Cholesterol: 190 mg/dL (ref 0–200)
HDL: 54.4 mg/dL (ref >39.00)
LDL Cholesterol: 119 mg/dL — ABNORMAL HIGH (ref 0–99)
NonHDL: 135.65
Total CHOL/HDL Ratio: 3
Triglycerides: 85 mg/dL (ref 0.0–149.0)
VLDL: 17 mg/dL (ref 0.0–40.0)

## 2021-06-05 LAB — VITAMIN B12: Vitamin B-12: 140 pg/mL — ABNORMAL LOW (ref 211–911)

## 2021-06-10 ENCOUNTER — Telehealth: Payer: Self-pay | Admitting: Nurse Practitioner

## 2021-06-10 NOTE — Telephone Encounter (Signed)
Called lvm for patient to call back. ?

## 2021-06-10 NOTE — Telephone Encounter (Signed)
-----   Message from [place] sent at 06/09/2021  3:09 PM EDT ----- ?Spoke with patient. Patient would like to try oral version of B12, please advise. She will think about the cholesterol medication part. ?

## 2021-06-10 NOTE — Telephone Encounter (Signed)
Called and spoke with pt regard vit b12 , she going  start otc  medication  and come back in 6 months to recheck labs. ?

## 2021-06-10 NOTE — Telephone Encounter (Signed)
Patient can do 1,01mg of B12 over the counter daily. We can recheck it when she comes for her 6 month follow up. She does not have an appointment scheduled that I can see ?

## 2021-06-20 ENCOUNTER — Ambulatory Visit (INDEPENDENT_AMBULATORY_CARE_PROVIDER_SITE_OTHER): Payer: Medicare Other

## 2021-06-20 VITALS — Wt 106.0 lb

## 2021-06-20 DIAGNOSIS — Z87891 Personal history of nicotine dependence: Secondary | ICD-10-CM

## 2021-06-20 DIAGNOSIS — Z0000 Encounter for general adult medical examination without abnormal findings: Secondary | ICD-10-CM | POA: Diagnosis not present

## 2021-06-20 NOTE — Patient Instructions (Signed)
Veronica Hartman , ?Thank you for taking time to come for your Medicare Wellness Visit. I appreciate your ongoing commitment to your health goals. Please review the following plan we discussed and let me know if I can assist you in the future.  ? ?Screening recommendations/referrals: ?Colonoscopy: Cologuard done 08/10/2017 - no longer required ?Mammogram: No longer required ?Bone Density: Ordered - please make appointment soon ?Recommended yearly ophthalmology/optometry visit for glaucoma screening and checkup ?Recommended yearly dental visit for hygiene and checkup ? ?Vaccinations: ?Influenza vaccine: Done 12/18/2020 - Repeat annually ?Pneumococcal vaccine: Declined - consider once per lifetime [id number] ?Tdap vaccine: Declined - recommended every 10 years ?Shingles vaccine: Declined - Shingrix is 2 doses 2-6 months apart and over 90% effective     ?Covid-19: Done  03/25/2019, 04/18/2019, 02/21/2020, & 12/18/2020   ? ?Advanced directives: Please bring a copy of your health care power of attorney and living will to the office to be added to your chart at your convenience.  ? ?Conditions/risks identified: Keep up the great work! Aim for 30 minutes of exercise or brisk walking, 6-8 glasses of water, and 5 servings of fruits and vegetables each day.  ? ?Next appointment: Follow up in one year for your annual wellness visit  ? ? ?Preventive Care 38 Years and Older, Female ?Preventive care refers to lifestyle choices and visits with your health care provider that can promote health and wellness. ?What does preventive care include? ?A yearly physical exam. This is also called an annual well check. ?Dental exams once or twice a year. ?Routine eye exams. Ask your health care provider how often you should have your eyes checked. ?Personal lifestyle choices, including: ?Daily care of your teeth and gums. ?Regular physical activity. ?Eating a healthy diet. ?Avoiding tobacco and drug use. ?Limiting alcohol use. ?Practicing safe sex. ?Taking  low-dose aspirin every day. ?Taking vitamin and mineral supplements as recommended by your health care provider. ?What happens during an annual well check? ?The services and screenings done by your health care provider during your annual well check will depend on your age, overall health, lifestyle risk factors, and family history of disease. ?Counseling  ?Your health care provider may ask you questions about your: ?Alcohol use. ?Tobacco use. ?Drug use. ?Emotional well-being. ?Home and relationship well-being. ?Sexual activity. ?Eating habits. ?History of falls. ?Memory and ability to understand (cognition). ?Work and work Statistician. ?Reproductive health. ?Screening  ?You may have the following tests or measurements: ?Height, weight, and BMI. ?Blood pressure. ?Lipid and cholesterol levels. These may be checked every 5 years, or more frequently if you are over 56 years old. ?Skin check. ?Lung cancer screening. You may have this screening every year starting at age 33 if you have a 30-pack-year history of smoking and currently smoke or have quit within the past 15 years. ?Fecal occult blood test (FOBT) of the stool. You may have this test every year starting at age 63. ?Flexible sigmoidoscopy or colonoscopy. You may have a sigmoidoscopy every 5 years or a colonoscopy every 10 years starting at age 89. ?Hepatitis C blood test. ?Hepatitis B blood test. ?Sexually transmitted disease (STD) testing. ?Diabetes screening. This is done by checking your blood sugar (glucose) after you have not eaten for a while (fasting). You may have this done every 1-3 years. ?Bone density scan. This is done to screen for osteoporosis. You may have this done starting at age 89. ?Mammogram. This may be done every 1-2 years. Talk to your health care provider about how often you  should have regular mammograms. ?Talk with your health care provider about your test results, treatment options, and if necessary, the need for more tests. ?Vaccines   ?Your health care provider may recommend certain vaccines, such as: ?Influenza vaccine. This is recommended every year. ?Tetanus, diphtheria, and acellular pertussis (Tdap, Td) vaccine. You may need a Td booster every 10 years. ?Zoster vaccine. You may need this after age 33. ?Pneumococcal 13-valent conjugate (PCV13) vaccine. One dose is recommended after age 87. ?Pneumococcal polysaccharide (PPSV23) vaccine. One dose is recommended after age 36. ?Talk to your health care provider about which screenings and vaccines you need and how often you need them. ?This information is not intended to replace advice given to you by your health care provider. Make sure you discuss any questions you have with your health care provider. ?Document Released: 03/01/2015 Document Revised: 10/23/2015 Document Reviewed: 12/04/2014 ?Elsevier Interactive Patient Education ? 2017 [place]. ? ?Fall Prevention in the Home ?Falls can cause injuries. They can happen to people of all ages. There are many things you can do to make your home safe and to help prevent falls. ?What can I do on the outside of my home? ?Regularly fix the edges of walkways and driveways and fix any cracks. ?Remove anything that might make you trip as you walk through a door, such as a raised step or threshold. ?Trim any bushes or trees on the path to your home. ?Use bright outdoor lighting. ?Clear any walking paths of anything that might make someone trip, such as rocks or tools. ?Regularly check to see if handrails are loose or broken. Make sure that both sides of any steps have handrails. ?Any raised decks and porches should have guardrails on the edges. ?Have any leaves, snow, or ice cleared regularly. ?Use sand or salt on walking paths during winter. ?Clean up any spills in your garage right away. This includes oil or grease spills. ?What can I do in the bathroom? ?Use night lights. ?Install grab bars by the toilet and in the tub and shower. Do not use towel  bars as grab bars. ?Use non-skid mats or decals in the tub or shower. ?If you need to sit down in the shower, use a plastic, non-slip stool. ?Keep the floor dry. Clean up any water that spills on the floor as soon as it happens. ?Remove soap buildup in the tub or shower regularly. ?Attach bath mats securely with double-sided non-slip rug tape. ?Do not have throw rugs and other things on the floor that can make you trip. ?What can I do in the bedroom? ?Use night lights. ?Make sure that you have a light by your bed that is easy to reach. ?Do not use any sheets or blankets that are too big for your bed. They should not hang down onto the floor. ?Have a firm chair that has side arms. You can use this for support while you get dressed. ?Do not have throw rugs and other things on the floor that can make you trip. ?What can I do in the kitchen? ?Clean up any spills right away. ?Avoid walking on wet floors. ?Keep items that you use a lot in easy-to-reach places. ?If you need to reach something above you, use a strong step stool that has a grab bar. ?Keep electrical cords out of the way. ?Do not use floor polish or wax that makes floors slippery. If you must use wax, use non-skid floor wax. ?Do not have throw rugs and other things on the  floor that can make you trip. ?What can I do with my stairs? ?Do not leave any items on the stairs. ?Make sure that there are handrails on both sides of the stairs and use them. Fix handrails that are broken or loose. Make sure that handrails are as long as the stairways. ?Check any carpeting to make sure that it is firmly attached to the stairs. Fix any carpet that is loose or worn. ?Avoid having throw rugs at the top or bottom of the stairs. If you do have throw rugs, attach them to the floor with carpet tape. ?Make sure that you have a light switch at the top of the stairs and the bottom of the stairs. If you do not have them, ask someone to add them for you. ?What else can I do to help  prevent falls? ?Wear shoes that: ?Do not have high heels. ?Have rubber bottoms. ?Are comfortable and fit you well. ?Are closed at the toe. Do not wear sandals. ?If you use a stepladder: ?Make sure that i

## 2021-06-20 NOTE — Progress Notes (Signed)
? ?Subjective:  ? Veronica Hartman is a 76 y.o. female who presents for Medicare Annual (Subsequent) preventive examination. ? ?Virtual Visit via Telephone Note ? ?I connected with  Veronica Hartman on 06/20/21 at  1:15 PM EDT by telephone and verified that I am speaking with the correct person using two identifiers. ? ?Location: ?Patient: Home ?Provider: [facility] ?Persons participating in the virtual visit: patient/Nurse Health Advisor ?  ?I discussed the limitations, risks, security and privacy concerns of performing an evaluation and management service by telephone and the availability of in person appointments. The patient expressed understanding and agreed to proceed. ? ?Interactive audio and video telecommunications were attempted between this nurse and patient, however failed, due to patient having technical difficulties OR patient did not have access to video capability.  We continued and completed visit with audio only. ? ?Some vital signs may be absent or patient reported.  ? ?Zacaria Pousson Dionne Ano, LPN  ? ?Review of Systems    ? ?Cardiac Risk Factors include: advanced age (>88mn, >>67women);hypertension ? ?   ?Objective:  ?  ?Today's Vitals  ? 06/20/21 1322  ?Weight: 106 lb (48.1 kg)  ? ?Body mass index is 19.23 kg/m?. ? ? ?  06/20/2021  ?  1:26 PM 08/24/2016  ?  2:23 PM 08/20/2016  ?  2:07 PM  ?Advanced Directives  ?Does Patient Have a Medical Advance Directive? Yes No No  ?[place]    ?[place]? No - copy requested    ?Would patient like information on creating a medical advance directive?  No - Patient declined No - Patient declined  ? ? ?Current Medications (verified) ?Outpatient Encounter Medications as of 06/20/2021  ?Medication Sig  ? vitamin B-12 (CYANOCOBALAMIN) 1000 MCG tablet Take 1,000 mcg by mouth daily.  ? ?No facility-administered encounter medications on file as of 06/20/2021.  ? ? ?Allergies (verified) ?Sulfa  antibiotics  ? ?History: ?Past Medical History:  ?Diagnosis Date  ? Alcohol abuse   ? in past and went through rehab and AA  ? Allergy   ? Anxiety   ? Cancer ([facility] 1995  ? breast left  ? Depression   ? in the past but not currently  ? Pneumonia   ? "has not had pneumonia in the last 13 years"  ? PONV (postoperative nausea and vomiting)   ? ?Past Surgical History:  ?Procedure Laterality Date  ? BREAST SURGERY    ? biopsy 1995  ? MASTECTOMY Left 1995  ? MASTECTOMY W/ SENTINEL NODE BIOPSY Right 08/24/2016  ? MASTECTOMY WITH AXILLARY LYMPH NODE DISSECTION Right 08/24/2016  ? Procedure: MASTECTOMY RIGHT WITH AXILLARY SENTINEL NODE BIOPSY;  Surgeon: NAlphonsa Overall MD;  [place] [facility]  Service: General;  Laterality: Right;  ? ?Family History  ?Problem Relation Age of Onset  ? Depression Mother   ? Hyperlipidemia Father   ? Alzheimer's disease Father   ? Heart disease Maternal Grandfather   ? ?Social History  ? ?Socioeconomic History  ? Marital status: Divorced  ?  Spouse name: Not on file  ? Number of children: 2  ? Years of education: Not on file  ? Highest education level: Not on file  ?Occupational History  ? Occupation: retired  ?Tobacco Use  ? Smoking status: Every Day  ?  Packs/day: 1.00  ?  Years: 55.00  ?  Pack years: 55.00  ?  Types: Cigarettes  ? Smokeless tobacco: Never  ?  Vaping Use  ? Vaping Use: Never used  ?Substance and Sexual Activity  ? Alcohol use: Not Currently  ? Drug use: No  ? Sexual activity: Not Currently  ?Other Topics Concern  ? Not on file  ?Social History Narrative  ? Not on file  ? ?Social Determinants of Health  ? ?Financial Resource Strain: Not on file  ?Food Insecurity: Not on file  ?Transportation Needs: Not on file  ?Physical Activity: Not on file  ?Stress: Not on file  ?Social Connections: Not on file  ? ? ?Tobacco Counseling ?Ready to quit: Not Answered ?Counseling given: Not Answered ? ? ?Clinical Intake: ? ?Pre-visit preparation completed: Yes ? ?Pain : No/denies pain ? ?  ? ?BMI -  recorded: 19.23 ?Nutritional Status: BMI of 19-24  Normal ?Nutritional Risks: None ?Diabetes: No ? ?How often do you need to have someone help you when you read instructions, pamphlets, or other written materials from your doctor or pharmacy?: 1 - Never ? ?Diabetic? no ? ?Interpreter Needed?: No ? ?Information entered by :: Cash Duce, LPN ? ? ?Activities of Daily Living ? ?  06/20/2021  ?  1:28 PM  ?In your present state of health, do you have any difficulty performing the following activities:  ?Hearing? 0  ?Vision? 0  ?Difficulty concentrating or making decisions? 0  ?Walking or climbing stairs? 0  ?Dressing or bathing? 0  ?Doing errands, shopping? 0  ?Preparing Food and eating ? N  ?Using the Toilet? N  ?In the past six months, have you accidently leaked urine? N  ?Do you have problems with loss of bowel control? N  ?Managing your Medications? N  ?Managing your Finances? N  ?Housekeeping or managing your Housekeeping? N  ? ? ?Patient Care Team: ?Michela Pitcher, NP as PCP - General (Pain Medicine) ? ?Indicate any recent Medical Services you may have received from other than [facility] providers in the past year (date may be approximate). ? ?   ?Assessment:  ? This is a routine wellness examination for Veronica Hartman. ? ?Hearing/Vision screen ?Hearing Screening - Comments:: Denies hearing difficulties   ?Vision Screening - Comments:: Wears rx glasses - behind with routine eye exams with [facility] ? ?Dietary issues and exercise activities discussed: ?Current Exercise Habits: Home exercise routine, Type of exercise: walking, Time (Minutes): 60, Frequency (Times/Week): 7, Weekly Exercise (Minutes/Week): 420, Intensity: Mild, Exercise limited by: None identified ? ? Goals Addressed   ? ?  ?  ?  ?  ? This Visit's Progress  ?  Patient Stated     ?  Wants to stay healthy and independent - continue taking care of her dogs, horses and plants ?  ? ?  ? ?Depression Screen ? ?  06/04/2021  ?  2:45 PM 07/08/2017  ?  9:30 AM  07/30/2016  ?  3:22 PM  ?PHQ 2/9 Scores  ?PHQ - 2 Score 0 0 0  ?  ?Fall Risk ? ?  06/20/2021  ?  1:23 PM 06/04/2021  ?  2:41 PM 07/08/2017  ?  9:30 AM 07/30/2016  ?  3:22 PM  ?Fall Risk   ?Falls in the past year? 0 0 No No  ?Number falls in past yr: 0 0    ?Injury with Fall? 0 0    ?Risk for fall due to : No Fall Risks     ?Follow up Falls prevention discussed     ? ? ?FALL RISK PREVENTION PERTAINING TO THE HOME: ? ?Any stairs in or  around the home? Yes  ?If so, are there any without handrails? No  ?Home free of loose throw rugs in walkways, pet beds, electrical cords, etc? Yes  ?Adequate lighting in your home to reduce risk of falls? Yes  ? ?ASSISTIVE DEVICES UTILIZED TO PREVENT FALLS: ? ?Life alert? No  ?Use of a cane, walker or w/c? No  ?Grab bars in the bathroom? No  ?Shower chair or bench in shower? No  ?Elevated toilet seat or a handicapped toilet? No  ? ?TIMED UP AND GO: ? ?Was the test performed? No . Telephonic visit ? ?Cognitive Function: ?  ?  ?  ? ?Immunizations ?Immunization History  ?Administered Date(s) Administered  ? PFIZER(Purple Top)SARS-COV-2 Vaccination 03/25/2019, 04/18/2019  ? Pension scheme manager 56yr & up 02/21/2020, 12/18/2020  ? ? ?TDAP status: Due, Education has been provided regarding the importance of this vaccine. Advised may receive this vaccine at local pharmacy or Health Dept. Aware to provide a copy of the vaccination record if obtained from local pharmacy or Health Dept. Verbalized acceptance and understanding. ? ?Flu Vaccine status: Up to date ? ?Pneumococcal vaccine status: Declined,  Education has been provided regarding the importance of this vaccine but patient still declined. Advised may receive this vaccine at local pharmacy or Health Dept. Aware to provide a copy of the vaccination record if obtained from local pharmacy or Health Dept. Verbalized acceptance and understanding.  ? ?Covid-19 vaccine status: Completed vaccines ? ?Qualifies for Shingles Vaccine?  Yes   ?Zostavax completed No   ?Shingrix Completed?: No.    Education has been provided regarding the importance of this vaccine. Patient has been advised to call insurance company to determine out of pocket expense

## 2021-06-23 ENCOUNTER — Other Ambulatory Visit: Payer: Self-pay | Admitting: Nurse Practitioner

## 2021-06-23 DIAGNOSIS — Z122 Encounter for screening for malignant neoplasm of respiratory organs: Secondary | ICD-10-CM

## 2021-07-03 LAB — COLOGUARD

## 2021-07-23 DIAGNOSIS — Z1211 Encounter for screening for malignant neoplasm of colon: Secondary | ICD-10-CM | POA: Diagnosis not present

## 2021-07-30 ENCOUNTER — Encounter: Payer: Self-pay | Admitting: *Deleted

## 2021-08-01 LAB — COLOGUARD: COLOGUARD: NEGATIVE

## 2021-11-27 ENCOUNTER — Telehealth: Payer: Self-pay

## 2021-11-27 NOTE — Telephone Encounter (Signed)
Patient needs an OV appointment and can do labs at that time. Left message to call back  Michela Pitcher, NP  Kris Mouton, CMA We can recheck the B12 but I wanted a 6 month follow up in office        Previous Messages    ----- Message -----  From: Veronica Hartman  Sent: 11/27/2021  11:00 AM EDT  To: Michela Pitcher, NP  Subject: Lab orders for Wednesday, 10.25.23             Lab order for B12??? thanks

## 2021-12-01 NOTE — Telephone Encounter (Signed)
Spoke with patient. Patient scheduled for 12/10/21 OV and cancelled lab visit

## 2021-12-10 ENCOUNTER — Encounter: Payer: Self-pay | Admitting: Nurse Practitioner

## 2021-12-10 ENCOUNTER — Other Ambulatory Visit: Payer: Medicare Other

## 2021-12-10 ENCOUNTER — Ambulatory Visit (INDEPENDENT_AMBULATORY_CARE_PROVIDER_SITE_OTHER): Payer: Medicare Other | Admitting: Nurse Practitioner

## 2021-12-10 VITALS — BP 136/84 | HR 112 | Temp 95.6°F | Resp 14 | Ht 62.25 in | Wt 105.2 lb

## 2021-12-10 DIAGNOSIS — N289 Disorder of kidney and ureter, unspecified: Secondary | ICD-10-CM | POA: Insufficient documentation

## 2021-12-10 DIAGNOSIS — Z13820 Encounter for screening for osteoporosis: Secondary | ICD-10-CM | POA: Diagnosis not present

## 2021-12-10 DIAGNOSIS — F419 Anxiety disorder, unspecified: Secondary | ICD-10-CM | POA: Diagnosis not present

## 2021-12-10 DIAGNOSIS — C50511 Malignant neoplasm of lower-outer quadrant of right female breast: Secondary | ICD-10-CM

## 2021-12-10 DIAGNOSIS — F32A Depression, unspecified: Secondary | ICD-10-CM | POA: Diagnosis not present

## 2021-12-10 DIAGNOSIS — I10 Essential (primary) hypertension: Secondary | ICD-10-CM

## 2021-12-10 DIAGNOSIS — E538 Deficiency of other specified B group vitamins: Secondary | ICD-10-CM | POA: Diagnosis not present

## 2021-12-10 LAB — BASIC METABOLIC PANEL
BUN: 15 mg/dL (ref 6–23)
CO2: 28 mEq/L (ref 19–32)
Calcium: 9.4 mg/dL (ref 8.4–10.5)
Chloride: 103 mEq/L (ref 96–112)
Creatinine, Ser: 1.09 mg/dL (ref 0.40–1.20)
GFR: 49.22 mL/min — ABNORMAL LOW (ref >60.00)
Glucose, Bld: 83 mg/dL (ref 70–99)
Potassium: 4.3 mEq/L (ref 3.5–5.1)
Sodium: 140 mEq/L (ref 135–145)

## 2021-12-10 LAB — VITAMIN B12: Vitamin B-12: 1197 pg/mL — ABNORMAL HIGH (ref 211–911)

## 2021-12-10 NOTE — Progress Notes (Signed)
Established Patient Office Visit  Subjective   Patient ID: Veronica Hartman, female    DOB: [birth year]  Age: 76 y.o. MRN: [id number]  Chief Complaint  Patient presents with   Follow-up    6 months. And follow up on B12    HPI  HTN: diet controlled.  Historical diagnosis.  Blood pressure within normal limits in office today.  COPD: States that it is rare that she gets short of breath. States that when she changes her bed and gets upstairs to wash she does feel short of breath.  Her neighbor did try and change a tire on a vehicle yesterday and she did not get short of breath.  States that she just got fatigued after the event more so than she thought she would.  Tobacco: has been smoking approx 1 pack a day for approx 55 years. States that she gets anxious. States that she did quit in the past cold [place] and was quit for approx 2 years.  Low-dose CT scan was ordered but patient has not had it performed yet.  Did encourage patient to do such to get a good gauge on her lungs since she is currently smoking.  B12 vitamin deficiency: This was an incidental finding last time of blood work.  Patient has been doing over-the-counter oral supplementation.  Pending labs today  States that she is taking care of the animals and house.    Review of Systems  Constitutional:  Positive for malaise/fatigue. Negative for chills and fever.  Respiratory:  Negative for shortness of breath (occ. DOE).   Cardiovascular:  Negative for chest pain.  Gastrointestinal:  Negative for abdominal pain, nausea and vomiting.  Neurological:  Negative for dizziness and headaches.  Psychiatric/Behavioral:  Negative for hallucinations and suicidal ideas.       Objective:     BP 136/84   Pulse (!) 112   Temp (!) 95.6 F (35.3 C) (Temporal)   Resp 14   Ht 5' 2.25" (1.581 m)   Wt 105 lb 4 oz (47.7 kg)   SpO2 98%   BMI 19.10 kg/m    Physical Exam Vitals and nursing note reviewed.  Constitutional:       Appearance: Normal appearance.  Cardiovascular:     Rate and Rhythm: Normal rate and regular rhythm.     Heart sounds: Normal heart sounds.  Pulmonary:     Effort: Pulmonary effort is normal.     Breath sounds: Normal breath sounds.  Musculoskeletal:     Right lower leg: No edema.     Left lower leg: No edema.  Lymphadenopathy:     Cervical: No cervical adenopathy.  Neurological:     Mental Status: She is alert.      No results found for any visits on 12/10/21.    The 10-year ASCVD risk score (Arnett DK, et al., 2019) is: 27.1%    Assessment & Plan:   Problem List Items Addressed This Visit       Cardiovascular and Mediastinum   HTN (hypertension)    Patient still controlling with diet lifestyle modifications.  Blood pressure within normal limits today.        Other   Anxiety and depression    Patient does endorse anxiety in regards to smoking and when having anxiety in regard to smoking she also smoked more.      Breast cancer of lower-outer quadrant of right female breast [facility])    Patient has had bilateral mastectomies with lymph node  removal.  Patient no longer followed by oncology.  No longer gets mammograms that she does not have breasts.      Screening for osteoporosis    DEXA scan has been ordered patient has not completed scan yet.  Patient was given information today in regards how to call and schedule DEXA scan at the [facility] imaging of breast center.      Decreased renal function   Relevant Orders   Basic metabolic panel   Vitamin Z20 deficiency - Primary    Incidental finding on previous blood work.  Patient's been doing over-the-counter oral supplementation.  Pending B12 level today      Relevant Orders   Vitamin B12    Return in about 6 months (around 06/11/2022) for CPE and labs.    Romilda Garret, NP

## 2021-12-10 NOTE — Assessment & Plan Note (Signed)
Patient still controlling with diet lifestyle modifications.  Blood pressure within normal limits today.

## 2021-12-10 NOTE — Assessment & Plan Note (Signed)
Patient does endorse anxiety in regards to smoking and when having anxiety in regard to smoking she also smoked more.

## 2021-12-10 NOTE — Assessment & Plan Note (Signed)
Patient has had bilateral mastectomies with lymph node removal.  Patient no longer followed by oncology.  No longer gets mammograms that she does not have breasts.

## 2021-12-10 NOTE — Patient Instructions (Signed)
Nice to see you today Use the papers and call and setup the bone density scan at the breast center Call and get the CT of the lungs set up at the [facility]  Follow up with me in 6 months for your physcial, sooner if you need me

## 2021-12-10 NOTE — Assessment & Plan Note (Signed)
DEXA scan has been ordered patient has not completed scan yet.  Patient was given information today in regards how to call and schedule DEXA scan at the [facility] imaging of breast center.

## 2021-12-10 NOTE — Assessment & Plan Note (Signed)
Incidental finding on previous blood work.  Patient's been doing over-the-counter oral supplementation.  Pending B12 level today

## 2022-06-09 ENCOUNTER — Telehealth: Payer: Self-pay | Admitting: Nurse Practitioner

## 2022-06-09 NOTE — Telephone Encounter (Signed)
Contacted Veronica Hartman to schedule their annual wellness visit. Appointment made for 06/24/2022.  [facility] Care Guide [facility] AWV TEAM Direct Dial: [phone]

## 2022-06-24 ENCOUNTER — Ambulatory Visit (INDEPENDENT_AMBULATORY_CARE_PROVIDER_SITE_OTHER): Payer: 59

## 2022-06-24 VITALS — Ht 63.0 in | Wt 106.0 lb

## 2022-06-24 DIAGNOSIS — Z0000 Encounter for general adult medical examination without abnormal findings: Secondary | ICD-10-CM
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
## 2022-06-24 NOTE — Patient Instructions (Signed)
Veronica Hartman , Thank you for taking time to come for your Medicare Wellness Visit. I appreciate your ongoing commitment to your health goals. Please review the following plan we discussed and let me know if I can assist you in the future.   These are the goals we discussed:  Goals      Patient Stated     Wants to stay healthy and independent - continue taking care of her dogs, horses and plants     Patient Stated     Will try to quit smoking and get vaccines.        This is a list of the screening recommended for you and due dates:  Health Maintenance  Topic Date Due   Pneumonia Vaccine (1 of 2 - PCV) Never done   DTaP/Tdap/Td vaccine (1 - Tdap) Never done   Zoster (Shingles) Vaccine (1 of 2) Never done   Screening for Lung Cancer  02/19/2006   DEXA scan (bone density measurement)  Never done   COVID-19 Vaccine (5 - 2023-24 season) 10/17/2021   Flu Shot  09/17/2022   Medicare Annual Wellness Visit  06/24/2023   Hepatitis C Screening: USPSTF Recommendation to screen - Ages 18-79 yo.  Completed   HPV Vaccine  Aged Out   Cologuard (Stool DNA test)  Discontinued    Advanced directives: Please bring a copy of your health care power of attorney and living will to the office to be added to your chart at your convenience.   Conditions/risks identified: Aim for 30 minutes of exercise or brisk walking, 6-8 glasses of water, and 5 servings of fruits and vegetables each day.   Next appointment: Follow up in one year for your annual wellness visit 06/28/23 @ 2:30 televisit   Preventive Care 65 Years and Older, Female Preventive care refers to lifestyle choices and visits with your health care provider that can promote health and wellness. What does preventive care include? A yearly physical exam. This is also called an annual well check. Dental exams once or twice a year. Routine eye exams. Ask your health care provider how often you should have your eyes checked. Personal lifestyle  choices, including: Daily care of your teeth and gums. Regular physical activity. Eating a healthy diet. Avoiding tobacco and drug use. Limiting alcohol use. Practicing safe sex. Taking low-dose aspirin every day. Taking vitamin and mineral supplements as recommended by your health care provider. What happens during an annual well check? The services and screenings done by your health care provider during your annual well check will depend on your age, overall health, lifestyle risk factors, and family history of disease. Counseling  Your health care provider may ask you questions about your: Alcohol use. Tobacco use. Drug use. Emotional well-being. Home and relationship well-being. Sexual activity. Eating habits. History of falls. Memory and ability to understand (cognition). Work and work Astronomer. Reproductive health. Screening  You may have the following tests or measurements: Height, weight, and BMI. Blood pressure. Lipid and cholesterol levels. These may be checked every 5 years, or more frequently if you are over 88 years old. Skin check. Lung cancer screening. You may have this screening every year starting at age 34 if you have a 30-pack-year history of smoking and currently smoke or have quit within the past 15 years. Fecal occult blood test (FOBT) of the stool. You may have this test every year starting at age 45. Flexible sigmoidoscopy or colonoscopy. You may have a sigmoidoscopy every 5 years or  a colonoscopy every 10 years starting at age 65. Hepatitis C blood test. Hepatitis B blood test. Sexually transmitted disease (STD) testing. Diabetes screening. This is done by checking your blood sugar (glucose) after you have not eaten for a while (fasting). You may have this done every 1-3 years. Bone density scan. This is done to screen for osteoporosis. You may have this done starting at age 24. Mammogram. This may be done every 1-2 years. Talk to your health care  provider about how often you should have regular mammograms. Talk with your health care provider about your test results, treatment options, and if necessary, the need for more tests. Vaccines  Your health care provider may recommend certain vaccines, such as: Influenza vaccine. This is recommended every year. Tetanus, diphtheria, and acellular pertussis (Tdap, Td) vaccine. You may need a Td booster every 10 years. Zoster vaccine. You may need this after age 13. Pneumococcal 13-valent conjugate (PCV13) vaccine. One dose is recommended after age 60. Pneumococcal polysaccharide (PPSV23) vaccine. One dose is recommended after age 90. Talk to your health care provider about which screenings and vaccines you need and how often you need them. This information is not intended to replace advice given to you by your health care provider. Make sure you discuss any questions you have with your health care provider. Document Released: 03/01/2015 Document Revised: 10/23/2015 Document Reviewed: 12/04/2014 Elsevier Interactive Patient Education  2017 [place] Prevention in the Home Falls can cause injuries. They can happen to people of all ages. There are many things you can do to make your home safe and to help prevent falls. What can I do on the outside of my home? Regularly fix the edges of walkways and driveways and fix any cracks. Remove anything that might make you trip as you walk through a door, such as a raised step or threshold. Trim any bushes or trees on the path to your home. Use bright outdoor lighting. Clear any walking paths of anything that might make someone trip, such as rocks or tools. Regularly check to see if handrails are loose or broken. Make sure that both sides of any steps have handrails. Any raised decks and porches should have guardrails on the edges. Have any leaves, snow, or ice cleared regularly. Use sand or salt on walking paths during winter. Clean up any  spills in your garage right away. This includes oil or grease spills. What can I do in the bathroom? Use night lights. Install grab bars by the toilet and in the tub and shower. Do not use towel bars as grab bars. Use non-skid mats or decals in the tub or shower. If you need to sit down in the shower, use a plastic, non-slip stool. Keep the floor dry. Clean up any water that spills on the floor as soon as it happens. Remove soap buildup in the tub or shower regularly. Attach bath mats securely with double-sided non-slip rug tape. Do not have throw rugs and other things on the floor that can make you trip. What can I do in the bedroom? Use night lights. Make sure that you have a light by your bed that is easy to reach. Do not use any sheets or blankets that are too big for your bed. They should not hang down onto the floor. Have a firm chair that has side arms. You can use this for support while you get dressed. Do not have throw rugs and other things on the floor that  can make you trip. What can I do in the kitchen? Clean up any spills right away. Avoid walking on wet floors. Keep items that you use a lot in easy-to-reach places. If you need to reach something above you, use a strong step stool that has a grab bar. Keep electrical cords out of the way. Do not use floor polish or wax that makes floors slippery. If you must use wax, use non-skid floor wax. Do not have throw rugs and other things on the floor that can make you trip. What can I do with my stairs? Do not leave any items on the stairs. Make sure that there are handrails on both sides of the stairs and use them. Fix handrails that are broken or loose. Make sure that handrails are as long as the stairways. Check any carpeting to make sure that it is firmly attached to the stairs. Fix any carpet that is loose or worn. Avoid having throw rugs at the top or bottom of the stairs. If you do have throw rugs, attach them to the floor  with carpet tape. Make sure that you have a light switch at the top of the stairs and the bottom of the stairs. If you do not have them, ask someone to add them for you. What else can I do to help prevent falls? Wear shoes that: Do not have high heels. Have rubber bottoms. Are comfortable and fit you well. Are closed at the toe. Do not wear sandals. If you use a stepladder: Make sure that it is fully opened. Do not climb a closed stepladder. Make sure that both sides of the stepladder are locked into place. Ask someone to hold it for you, if possible. Clearly mark and make sure that you can see: Any grab bars or handrails. First and last steps. Where the edge of each step is. Use tools that help you move around (mobility aids) if they are needed. These include: Canes. Walkers. Scooters. Crutches. Turn on the lights when you go into a dark area. Replace any light bulbs as soon as they burn out. Set up your furniture so you have a clear path. Avoid moving your furniture around. If any of your floors are uneven, fix them. If there are any pets around you, be aware of where they are. Review your medicines with your doctor. Some medicines can make you feel dizzy. This can increase your chance of falling. Ask your doctor what other things that you can do to help prevent falls. This information is not intended to replace advice given to you by your health care provider. Make sure you discuss any questions you have with your health care provider. Document Released: 11/29/2008 Document Revised: 07/11/2015 Document Reviewed: 03/09/2014 Elsevier Interactive Patient Education  2017 [employer].

## 2022-08-10 ENCOUNTER — Encounter: Payer: Self-pay | Admitting: Nurse Practitioner

## 2022-08-10 ENCOUNTER — Ambulatory Visit (INDEPENDENT_AMBULATORY_CARE_PROVIDER_SITE_OTHER): Payer: 59 | Admitting: Nurse Practitioner

## 2022-08-10 VITALS — BP 138/78 | HR 102 | Temp 98.0°F | Resp 16 | Ht 63.0 in | Wt 105.2 lb

## 2022-08-10 DIAGNOSIS — F17200 Nicotine dependence, unspecified, uncomplicated: Secondary | ICD-10-CM

## 2022-08-10 DIAGNOSIS — E7800 Pure hypercholesterolemia, unspecified: Secondary | ICD-10-CM

## 2022-08-10 DIAGNOSIS — Z23 Encounter for immunization: Secondary | ICD-10-CM

## 2022-08-10 DIAGNOSIS — Z122 Encounter for screening for malignant neoplasm of respiratory organs: Secondary | ICD-10-CM | POA: Diagnosis not present

## 2022-08-10 DIAGNOSIS — E538 Deficiency of other specified B group vitamins: Secondary | ICD-10-CM

## 2022-08-10 DIAGNOSIS — Z0000 Encounter for general adult medical examination without abnormal findings: Secondary | ICD-10-CM | POA: Diagnosis not present

## 2022-08-10 DIAGNOSIS — Z13820 Encounter for screening for osteoporosis: Secondary | ICD-10-CM | POA: Diagnosis not present

## 2022-08-10 NOTE — Assessment & Plan Note (Signed)
Current everyday smoker will check urine microscopy for microscopic hematuria.

## 2022-08-10 NOTE — Assessment & Plan Note (Signed)
History of the same.  Patient not on any supplementation currently pending vitamin B12 level today

## 2022-08-10 NOTE — Patient Instructions (Signed)
Nice to see you today We updated your pneumonia vaccine today Follow up with me in 1 year, sooner if you need me  I will be in touch with the labs once I have reviewed them

## 2022-08-10 NOTE — Assessment & Plan Note (Signed)
History of the same pending lipid panel 

## 2022-08-10 NOTE — Progress Notes (Signed)
Established Patient Office Visit  Subjective   Patient ID: ALEENAH HOMEN, female    DOB: [birth year]  Age: 77 y.o. MRN: [id number]  Chief Complaint  Patient presents with   Annual Exam    HPI  for complete physical and follow up of chronic conditions.  Vitamin B12 def: currently on otc supplementation   Hx breast Cancer: History of same with bilateral mastectomy. Immunizations: -Tetanus: unsure can update at local pharmacy -Influenza: out of season  -Shingles:get at local pharmacy  -Pneumonia: Needs prevnar 20 in office today Covid: pfizer  Diet: Fair diet. 2 meals a day with some snacking. Water and coffee  Exercise: No regular exercise. Cares for horses and 4 dogs   Eye exam:  needs updating wears glasses. Cataract surgery approx 2-3 years  Dental exam: Needs updating    Colonoscopy: Completed cologuard 07/23/2021 that was negative Lung Cancer Screening: Ambulatory referral today  Pap smear: Aged out.  Dexa: Order placed today patient given information to call and schedule  Mammogram: Double mastectomy.  Sleep: state that she goes to sleep around 8 and will get up around 4am. States that she feels rested sometimes. Does not know if she snores       Review of Systems  Constitutional:  Negative for chills and fever.  Respiratory:  Positive for cough (in the morning). Negative for shortness of breath.   Cardiovascular:  Negative for chest pain and leg swelling.  Gastrointestinal:  Negative for abdominal pain, blood in stool, constipation, diarrhea, nausea and vomiting.       BM daily   Genitourinary:  Negative for dysuria and hematuria.  Neurological:  Negative for tingling and headaches.  Psychiatric/Behavioral:  Negative for hallucinations and suicidal ideas.       Objective:     BP 138/78   Pulse (!) 102   Temp 98 F (36.7 C)   Resp 16   Ht 5\' 3"  (1.6 m)   Wt 105 lb 4 oz (47.7 kg)   SpO2 99%   BMI 18.64 kg/m  BP Readings from Last 3  Encounters:  08/10/22 138/78  12/10/21 136/84  06/04/21 138/86   Wt Readings from Last 3 Encounters:  08/10/22 105 lb 4 oz (47.7 kg)  06/24/22 106 lb (48.1 kg)  12/10/21 105 lb 4 oz (47.7 kg)      Physical Exam Vitals and nursing note reviewed.  Constitutional:      Appearance: Normal appearance.  HENT:     Right Ear: Tympanic membrane, ear canal and external ear normal.     Left Ear: Tympanic membrane, ear canal and external ear normal.     Mouth/Throat:     Mouth: Mucous membranes are moist.     Pharynx: Oropharynx is clear.  Eyes:     Extraocular Movements: Extraocular movements intact.     Pupils: Pupils are equal, round, and reactive to light.  Cardiovascular:     Rate and Rhythm: Normal rate and regular rhythm.     Pulses: Normal pulses.     Heart sounds: Normal heart sounds.  Pulmonary:     Effort: Pulmonary effort is normal.     Breath sounds: Normal breath sounds.  Abdominal:     General: Bowel sounds are normal. There is no distension.     Palpations: There is no mass.     Tenderness: There is no abdominal tenderness.     Hernia: No hernia is present.  Musculoskeletal:     Right lower leg: No edema.  Left lower leg: No edema.  Lymphadenopathy:     Cervical: No cervical adenopathy.  Skin:    General: Skin is warm.  Neurological:     General: No focal deficit present.     Mental Status: She is alert.     Deep Tendon Reflexes:     Reflex Scores:      Bicep reflexes are 2+ on the right side and 2+ on the left side.      Patellar reflexes are 2+ on the right side and 2+ on the left side.    Comments: Bilateral upper and lower extremity strength 5/5  Psychiatric:        Mood and Affect: Mood normal.        Behavior: Behavior normal.        Thought Content: Thought content normal.        Judgment: Judgment normal.      No results found for any visits on 08/10/22.     The 10-year ASCVD risk score (Arnett DK, et al., 2019) is: 30%    Assessment  & Plan:   Problem List Items Addressed This Visit       Other   Smoker    Current everyday smoker will check urine microscopy for microscopic hematuria.      Relevant Orders   Urine Microscopic   Preventative health care - Primary    Discussed age-appropriate immunizations and screening exams.  Did review patient's personal, surgical, social, family histories.  Update pneumonia vaccine in office today.  Patient get tetanus and shingles vaccine at local pharmacy.  Patient is up-to-date on CRC screening.  No longer does cervical cancer screening due to age no evidence of mammograms due to bilateral mastectomy.  Patient was given information at discharge about preventative healthcare maintenance with anticipatory guidance.      Relevant Orders   CBC   Comprehensive metabolic panel   TSH   Vitamin B12 deficiency    History of the same.  Patient not on any supplementation currently pending vitamin B12 level today      Relevant Orders   Vitamin B12   Pure hypercholesterolemia    History of the same pending lipid panel.      Relevant Orders   Lipid panel   Other Visit Diagnoses     Screening for lung cancer       Relevant Orders   Ambulatory Referral Lung Cancer Screening [facility]   Need for pneumococcal 20-valent conjugate vaccination       Relevant Orders   Pneumococcal conjugate vaccine 20-valent (Prevnar 20) (Completed)       Return in about 1 year (around 08/10/2023) for CPE and Labs.    Audria Nine, NP

## 2022-08-10 NOTE — Assessment & Plan Note (Signed)
Discussed age-appropriate immunizations and screening exams.  Did review patient's personal, surgical, social, family histories.  Update pneumonia vaccine in office today.  Patient get tetanus and shingles vaccine at local pharmacy.  Patient is up-to-date on CRC screening.  No longer does cervical cancer screening due to age no evidence of mammograms due to bilateral mastectomy.  Patient was given information at discharge about preventative healthcare maintenance with anticipatory guidance.

## 2022-08-11 LAB — CBC
HCT: 44.1 % (ref 36.0–46.0)
Hemoglobin: 14.3 g/dL (ref 12.0–15.0)
MCHC: 32.4 g/dL (ref 30.0–36.0)
MCV: 92.6 fl (ref 78.0–100.0)
Platelets: 314 10*3/uL (ref 150.0–400.0)
RBC: 4.77 Mil/uL (ref 3.87–5.11)
RDW: 14.3 % (ref 11.5–15.5)
WBC: 9.2 10*3/uL (ref 4.0–10.5)

## 2022-08-11 LAB — COMPREHENSIVE METABOLIC PANEL
ALT: 8 U/L (ref 0–35)
AST: 16 U/L (ref 0–37)
Albumin: 4.2 g/dL (ref 3.5–5.2)
Alkaline Phosphatase: 96 U/L (ref 39–117)
BUN: 20 mg/dL (ref 6–23)
CO2: 29 mEq/L (ref 19–32)
Calcium: 9.9 mg/dL (ref 8.4–10.5)
Chloride: 102 mEq/L (ref 96–112)
Creatinine, Ser: 1.24 mg/dL — ABNORMAL HIGH (ref 0.40–1.20)
GFR: 41.97 mL/min — ABNORMAL LOW (ref >60.00)
Glucose, Bld: 91 mg/dL (ref 70–99)
Potassium: 4.8 mEq/L (ref 3.5–5.1)
Sodium: 139 mEq/L (ref 135–145)
Total Bilirubin: 0.4 mg/dL (ref 0.2–1.2)
Total Protein: 7.3 g/dL (ref 6.0–8.3)

## 2022-08-11 LAB — LIPID PANEL
Cholesterol: 188 mg/dL (ref 0–200)
HDL: 53.9 mg/dL (ref >39.00)
LDL Cholesterol: 115 mg/dL — ABNORMAL HIGH (ref 0–99)
NonHDL: 134.35
Total CHOL/HDL Ratio: 3
Triglycerides: 96 mg/dL (ref 0.0–149.0)
VLDL: 19.2 mg/dL (ref 0.0–40.0)

## 2022-08-11 LAB — URINALYSIS, MICROSCOPIC ONLY

## 2022-08-11 LAB — TSH: TSH: 2.77 u[IU]/mL (ref 0.35–5.50)

## 2022-08-11 LAB — VITAMIN B12: Vitamin B-12: 370 pg/mL (ref 211–911)

## 2023-06-28 ENCOUNTER — Ambulatory Visit (INDEPENDENT_AMBULATORY_CARE_PROVIDER_SITE_OTHER): Payer: 59

## 2023-06-28 VITALS — BP 138/78 | Ht 63.0 in | Wt 110.0 lb

## 2023-06-28 DIAGNOSIS — Z2821 Immunization not carried out because of patient refusal: Secondary | ICD-10-CM | POA: Diagnosis not present

## 2023-06-28 DIAGNOSIS — Z0000 Encounter for general adult medical examination without abnormal findings: Secondary | ICD-10-CM | POA: Diagnosis not present

## 2023-06-28 NOTE — Patient Instructions (Signed)
 Ms. Veronica Hartman , Thank you for taking time out of your busy schedule to complete your Annual Wellness Visit with me. I enjoyed our conversation and look forward to speaking with you again next year. I, as well as your care team,  appreciate your ongoing commitment to your health goals. Please review the following plan we discussed and let me know if I can assist you in the future. Your Game plan/ To Do List    Referrals: If you haven't heard from the office you've been referred to, please reach out to them at the phone provided.   Follow up Visits: Next Medicare AWV with our clinical staff: 06/29/2024   Have you seen your provider in the last 6 months (3 months if uncontrolled diabetes)? Yes Next Office Visit with your provider: 08/19/2023  Clinician Recommendations:  Aim for 30 minutes of exercise or brisk walking, 6-8 glasses of water, and 5 servings of fruits and vegetables each day.       This is a list of the screening recommended for you and due dates:  Health Maintenance  Topic Date Due   DTaP/Tdap/Td vaccine (1 - Tdap) Never done   Zoster (Shingles) Vaccine (1 of 2) Never done   Screening for Lung Cancer  02/19/2006   DEXA scan (bone density measurement)  Never done   COVID-19 Vaccine (5 - 2024-25 season) 10/18/2022   Flu Shot  09/17/2023   Medicare Annual Wellness Visit  06/27/2024   Pneumonia Vaccine  Completed   Hepatitis C Screening  Completed   HPV Vaccine  Aged Out   Meningitis B Vaccine  Aged Out   Cologuard (Stool DNA test)  Discontinued    Advanced directives: (Declined) Advance directive discussed with you today. Even though you declined this today, please call our office should you change your mind, and we can give you the proper paperwork for you to fill out. Advance Care Planning is important because it:  [x]  Makes sure you receive the medical care that is consistent with your values, goals, and preferences  [x]  It provides guidance to your family and loved ones  and reduces their decisional burden about whether or not they are making the right decisions based on your wishes.  Follow the link provided in your after visit summary or read over the paperwork we have mailed to you to help you started getting your Advance Directives in place. If you need assistance in completing these, please reach out to us  so that we can help you!  See attachments for Preventive Care and Fall Prevention Tips.

## 2023-06-28 NOTE — Progress Notes (Signed)
 Because this visit was a virtual/telehealth visit,  certain criteria was not obtained, such a blood pressure, CBG if applicable, and timed get up and go. Any medications not marked as "taking" were not mentioned during the medication reconciliation part of the visit. Any vitals not documented were not able to be obtained due to this being a telehealth visit or patient was unable to self-report a recent blood pressure reading due to a lack of equipment at home via telehealth. Vitals that have been documented are verbally provided by the patient.   This visit was performed by a medical professional under my direct supervision. I was immediately available for consultation/collaboration. I have reviewed and agree with the Annual Wellness Visit documentation.  Subjective:   Veronica Hartman is a 78 y.o. who presents for a Medicare Wellness preventive visit.  As a reminder, Annual Wellness Visits don't include a physical exam, and some assessments may be limited, especially if this visit is performed virtually. We may recommend an in-person visit if needed.  Visit Complete: Virtual I connected with  Veronica Hartman on 06/28/23 by a audio enabled telemedicine application and verified that I am speaking with the correct person using two identifiers.  Patient Location: Home  Provider Location: Home Office  I discussed the limitations of evaluation and management by telemedicine. The patient expressed understanding and agreed to proceed.  Vital Signs: Because this visit was a virtual/telehealth visit, some criteria may be missing or patient reported. Any vitals not documented were not able to be obtained and vitals that have been documented are patient reported.  VideoDeclined- This patient declined Librarian, academic. Therefore the visit was completed with audio only.  Persons Participating in Visit: Patient.  AWV Questionnaire: Yes: Patient Medicare AWV questionnaire was  completed by the patient on 06/24/2023; I have confirmed that all information answered by patient is correct and no changes since this date.  Cardiac Risk Factors include: advanced age (>58men, >73 women);hypertension     Objective:     Today's Vitals   06/28/23 1519  BP: 138/78  Weight: 110 lb (49.9 kg)  Height: 5\' 3"  (1.6 m)   Body mass index is 19.49 kg/m.     06/28/2023    3:18 PM 06/24/2022    2:50 PM 06/20/2021    1:26 PM 08/24/2016    2:23 PM 08/20/2016    2:07 PM  Advanced Directives  Does Patient Have a Medical Advance Directive? No Yes Yes No No  Type of Special educational needs teacher of [place];Living will Healthcare Power of [place];Living will    Copy of Healthcare Power of Attorney in Chart?  No - copy requested No - copy requested    Would patient like information on creating a medical advance directive? No - Patient declined No - Patient declined  No - Patient declined No - Patient declined    Current Medications (verified) Outpatient Encounter Medications as of 06/28/2023  Medication Sig   vitamin B-12 (CYANOCOBALAMIN ) 1000 MCG tablet Take 1,000 mcg by mouth daily. (Patient not taking: Reported on 06/28/2023)   No facility-administered encounter medications on file as of 06/28/2023.    Allergies (verified) Sulfa antibiotics   History: Past Medical History:  Diagnosis Date   Alcohol abuse    in past and went through rehab and AA   Allergy    Anxiety    Cancer (HCC) 1995   breast left   Depression    in the past but not currently  Pneumonia    "has not had pneumonia in the last 13 years"   PONV (postoperative nausea and vomiting)    Past Surgical History:  Procedure Laterality Date   BREAST SURGERY     biopsy 1995   MASTECTOMY Left 1995   MASTECTOMY W/ SENTINEL NODE BIOPSY Right 08/24/2016   MASTECTOMY WITH AXILLARY LYMPH NODE DISSECTION Right 08/24/2016   Procedure: MASTECTOMY RIGHT WITH AXILLARY SENTINEL NODE BIOPSY;  Surgeon: Juanita Norlander,  MD;  Location: [facility] OR;  Service: General;  Laterality: Right;   Family History  Problem Relation Age of Onset   Depression Mother    Hyperlipidemia Father    Alzheimer's disease Father    Heart disease Maternal Grandfather    Social History   Socioeconomic History   Marital status: Divorced    Spouse name: Not on file   Number of children: 2   Years of education: Not on file   Highest education level: Bachelor's degree (e.g., BA, AB, BS)  Occupational History   Occupation: retired  Tobacco Use   Smoking status: Every Day    Current packs/day: 1.00    Average packs/day: 1 pack/day for 55.0 years (55.0 ttl pk-yrs)    Types: Cigarettes   Smokeless tobacco: Never  Vaping Use   Vaping status: Never Used  Substance and Sexual Activity   Alcohol use: Not Currently   Drug use: No   Sexual activity: Not Currently  Other Topics Concern   Not on file  Social History Narrative   Not on file   Social Drivers of Health   Financial Resource Strain: Low Risk  (06/24/2023)   Overall Financial Resource Strain (CARDIA)    Difficulty of Paying Living Expenses: Not very hard  Food Insecurity: No Food Insecurity (06/24/2023)   Hunger Vital Sign    Worried About Running Out of Food in the Last Year: Never true    Ran Out of Food in the Last Year: Never true  Transportation Needs: No Transportation Needs (06/28/2023)   PRAPARE - Administrator, Civil Service (Medical): No    Lack of Transportation (Non-Medical): No  Physical Activity: Sufficiently Active (06/24/2023)   Exercise Vital Sign    Days of Exercise per Week: 5 days    Minutes of Exercise per Session: 30 min  Recent Concern: Physical Activity - Inactive (06/24/2023)   Exercise Vital Sign    Days of Exercise per Week: 0 days    Minutes of Exercise per Session: 0 min  Stress: No Stress Concern Present (06/24/2023)   [employer] of Occupational Health - Occupational Stress Questionnaire    Feeling of Stress : Only a  little  Social Connections: Socially Isolated (06/24/2023)   Social Connection and Isolation Panel [NHANES]    Frequency of Communication with Friends and Family: More than three times a week    Frequency of Social Gatherings with Friends and Family: Twice a week    Attends Religious Services: Never    Database administrator or Organizations: No    Attends Engineer, structural: Not on file    Marital Status: Divorced    Tobacco Counseling Ready to quit: Not Answered Counseling given: Not Answered    Clinical Intake:  Pre-visit preparation completed: Yes  Pain : No/denies pain     BMI - recorded: 19.49 Nutritional Status: BMI of 19-24  Normal Nutritional Risks: None Diabetes: No  No results found for: "HGBA1C"   How often do you need to have someone  help you when you read instructions, pamphlets, or other written materials from your doctor or pharmacy?: 1 - Never What is the last grade level you completed in school?: BS degree  Interpreter Needed?: No  Information entered by :: Juliann Ochoa   Activities of Daily Living     06/24/2023    9:42 AM  In your present state of health, do you have any difficulty performing the following activities:  Hearing? 0  Vision? 0  Difficulty concentrating or making decisions? 0  Walking or climbing stairs? 0  Dressing or bathing? 0  Doing errands, shopping? 0  Preparing Food and eating ? N  Using the Toilet? N  In the past six months, have you accidently leaked urine? N  Do you have problems with loss of bowel control? N  Managing your Medications? N  Managing your Finances? N  Housekeeping or managing your Housekeeping? N    Patient Care Team: Dorothe Gaster, NP as PCP - General (Pain Medicine)  Indicate any recent Medical Services you may have received from other than [facility] providers in the past year (date may be approximate).     Assessment:    This is a routine wellness examination for  Veronica Hartman.  Hearing/Vision screen Hearing Screening - Comments:: No hearing difficulties Vision Screening - Comments:: Wears glasses    Goals Addressed             This Visit's Progress    Patient Stated   On track    Wants to stay healthy and independent - continue taking care of her dogs, horses and plants       Depression Screen     06/28/2023    3:25 PM 08/10/2022    2:02 PM 06/24/2022    2:49 PM 06/20/2021    1:42 PM 06/04/2021    2:45 PM 07/08/2017    9:30 AM 07/30/2016    3:22 PM  PHQ 2/9 Scores  PHQ - 2 Score 0 0 0 0 0 0 0  PHQ- 9 Score 0 0         Fall Risk     06/24/2023    9:42 AM 08/10/2022    2:02 PM 06/24/2022    2:51 PM 06/24/2022    7:05 AM 06/20/2021    1:23 PM  Fall Risk   Falls in the past year? 0 0 1 0 0  Number falls in past yr: 0 0 0  0  Comment   Horse ran into her.    Injury with Fall? 0 0 0  0  Risk for fall due to : History of fall(s) No Fall Risks No Fall Risks  No Fall Risks  Follow up Falls evaluation completed Falls evaluation completed Falls prevention discussed;Falls evaluation completed;Education provided  Falls prevention discussed    MEDICARE RISK AT HOME:  Medicare Risk at Home Any stairs in or around the home?: (Patient-Rptd) Yes If so, are there any without handrails?: (Patient-Rptd) No Home free of loose throw rugs in walkways, pet beds, electrical cords, etc?: (Patient-Rptd) No Adequate lighting in your home to reduce risk of falls?: (Patient-Rptd) Yes Life alert?: (Patient-Rptd) No Use of a cane, walker or w/c?: (Patient-Rptd) No Grab bars in the bathroom?: (Patient-Rptd) No Shower chair or bench in shower?: (Patient-Rptd) No Elevated toilet seat or a handicapped toilet?: (Patient-Rptd) No  TIMED UP AND GO:  Was the test performed?  No  Cognitive Function: 6CIT completed        06/28/2023  3:21 PM 06/24/2022    2:53 PM 06/20/2021    1:29 PM  6CIT Screen  What Year? 0 points 0 points 0 points  What month? 0 points 0 points  0 points  What time? 0 points 0 points 0 points  Count back from 20 0 points 0 points 0 points  Months in reverse 0 points 0 points 0 points  Repeat phrase 0 points 2 points 2 points  Total Score 0 points 2 points 2 points    Immunizations Immunization History  Administered Date(s) Administered   Influenza-Unspecified 12/18/2020   PFIZER(Purple Top)SARS-COV-2 Vaccination 03/25/2019, 04/18/2019   PNEUMOCOCCAL CONJUGATE-20 08/10/2022   Pfizer Covid-19 Vaccine Bivalent Booster 73yrs & up 02/21/2020, 12/18/2020    Screening Tests Health Maintenance  Topic Date Due   DTaP/Tdap/Td (1 - Tdap) Never done   Zoster Vaccines- Shingrix (1 of 2) Never done   Lung Cancer Screening  02/19/2006   DEXA SCAN  Never done   COVID-19 Vaccine (5 - 2024-25 season) 10/18/2022   INFLUENZA VACCINE  09/17/2023   Medicare Annual Wellness (AWV)  06/27/2024   Pneumonia Vaccine 40+ Years old  Completed   Hepatitis C Screening  Completed   HPV VACCINES  Aged Out   Meningococcal B Vaccine  Aged Out   Fecal DNA (Cologuard)  Discontinued    Health Maintenance  Health Maintenance Due  Topic Date Due   DTaP/Tdap/Td (1 - Tdap) Never done   Zoster Vaccines- Shingrix (1 of 2) Never done   Lung Cancer Screening  02/19/2006   DEXA SCAN  Never done   COVID-19 Vaccine (5 - 2024-25 season) 10/18/2022   Health Maintenance Items Addressed:patient declined health maintenance items  Additional Screening:  Vision Screening: Recommended annual ophthalmology exams for early detection of glaucoma and other disorders of the eye.  Dental Screening: Recommended annual dental exams for proper oral hygiene  Community Resource Referral / Chronic Care Management: CRR required this visit?  No   CCM required this visit?  No   Plan:    I have personally reviewed and noted the following in the patient's chart:   Medical and social history Use of alcohol, tobacco or illicit drugs  Current medications and supplements  including opioid prescriptions. Patient is not currently taking opioid prescriptions. Functional ability and status Nutritional status Physical activity Advanced directives List of other physicians Hospitalizations, surgeries, and ER visits in previous 12 months Vitals Screenings to include cognitive, depression, and falls Referrals and appointments  In addition, I have reviewed and discussed with patient certain preventive protocols, quality metrics, and best practice recommendations. A written personalized care plan for preventive services as well as general preventive health recommendations were provided to patient.   Veronica Hartman, [place]   06/28/2023   After Visit Summary: (MyChart) Due to this being a telephonic visit, the after visit summary with patients personalized plan was offered to patient via MyChart   Notes: Nothing significant to report at this time.

## 2023-08-11 ENCOUNTER — Encounter: Payer: 59 | Admitting: Nurse Practitioner

## 2023-08-19 ENCOUNTER — Ambulatory Visit (INDEPENDENT_AMBULATORY_CARE_PROVIDER_SITE_OTHER): Admitting: Nurse Practitioner

## 2023-08-19 ENCOUNTER — Encounter: Payer: Self-pay | Admitting: Nurse Practitioner

## 2023-08-19 VITALS — BP 138/100 | HR 95 | Temp 97.9°F | Ht 62.5 in | Wt 104.2 lb

## 2023-08-19 DIAGNOSIS — Z0000 Encounter for general adult medical examination without abnormal findings: Secondary | ICD-10-CM

## 2023-08-19 DIAGNOSIS — E538 Deficiency of other specified B group vitamins: Secondary | ICD-10-CM

## 2023-08-19 DIAGNOSIS — I10 Essential (primary) hypertension: Secondary | ICD-10-CM

## 2023-08-19 DIAGNOSIS — E7800 Pure hypercholesterolemia, unspecified: Secondary | ICD-10-CM | POA: Diagnosis not present

## 2023-08-19 DIAGNOSIS — Z853 Personal history of malignant neoplasm of breast: Secondary | ICD-10-CM | POA: Diagnosis not present

## 2023-08-19 DIAGNOSIS — F419 Anxiety disorder, unspecified: Secondary | ICD-10-CM

## 2023-08-19 DIAGNOSIS — F32A Depression, unspecified: Secondary | ICD-10-CM

## 2023-08-19 DIAGNOSIS — Z720 Tobacco use: Secondary | ICD-10-CM | POA: Diagnosis not present

## 2023-08-19 NOTE — Assessment & Plan Note (Signed)
 History of the same pending lipid panel today

## 2023-08-19 NOTE — Patient Instructions (Signed)
 Nice to see you today I will be in touch with the labs once I have them Follow up with me in 1 year, sooner if you need me

## 2023-08-19 NOTE — Assessment & Plan Note (Signed)
 History of the same in remission currently patient denies HI/SI/AVH.  Stable

## 2023-08-19 NOTE — Assessment & Plan Note (Signed)
 History of the same.  Patient declined LDCT.  Will check urine microscopy to rule out microscopic hematuria.  Discussed about smoking cessation.  Patient has tried patches in the past with some success but not interested currently

## 2023-08-19 NOTE — Progress Notes (Signed)
 Established Patient Office Visit  Subjective   Patient ID: Veronica Hartman, female    DOB: 27-Jan-1946  Age: 78 y.o. MRN: [id number]  Chief Complaint  Patient presents with   Annual Exam    HPI  HTN: Patient currently maintained on lifestyle modifications only.  Breast cancer history: History of breast cancer in patient's 30s and then again at 49.  Patient was offered consult with oncology in the past and politely declined. Hx of bilateral mastectomy   Vitamin B12 deficiency: Was repleted orally in the past.  Not currently on any supplementation currently  for complete physical and follow up of chronic conditions.  Immunizations: -Tetanus: Completed in 2025 -Influenza: Out of season -Shingles: first shot complete 08/07/2023 -Pneumonia: Completed 2024  Diet: Fair diet. She is eating 2 melas a day and a protein bar or snack. Coffee and water  Exercise: No regular exercise. Taking care of her animals   Eye exam:  PRN hx of cataracts. Will use close up   Dental exam: PRN   Colonoscopy: Completed in 07/23/2021 Cologuard that was negative Lung Cancer Screening: refused   Pap Smear: Aged out  mammogram: Bilateral mastectomy  DEXA: refused   Sleep: goes to bed around 630p and get up around 4-5 in the am. Feels rested.   Advanced directives: She has a will and it mentions her wishes. States that her and her son made it over the computer and knows her wishes       Review of Systems  Constitutional:  Negative for chills and fever.  Respiratory:  Negative for shortness of breath.   Cardiovascular:  Negative for chest pain and leg swelling.  Gastrointestinal:  Negative for abdominal pain, blood in stool, constipation, diarrhea, nausea and vomiting.       Bm daily   Genitourinary:  Negative for dysuria and hematuria.  Neurological:  Positive for tingling (toes that is intermittently). Negative for headaches.  Psychiatric/Behavioral:  Negative for hallucinations and  suicidal ideas.       Objective:     BP (!) 138/100   Pulse 95   Temp 97.9 F (36.6 C) (Oral)   Ht 5' 2.5 (1.588 m)   Wt 104 lb 3.2 oz (47.3 kg)   SpO2 95%   BMI 18.75 kg/m  BP Readings from Last 3 Encounters:  08/19/23 (!) 138/100  06/28/23 138/78  08/10/22 138/78   Wt Readings from Last 3 Encounters:  08/19/23 104 lb 3.2 oz (47.3 kg)  06/28/23 110 lb (49.9 kg)  08/10/22 105 lb 4 oz (47.7 kg)   SpO2 Readings from Last 3 Encounters:  08/19/23 95%  08/10/22 99%  12/10/21 98%      Physical Exam Vitals and nursing note reviewed.  Constitutional:      Appearance: Normal appearance.  HENT:     Right Ear: Tympanic membrane, ear canal and external ear normal.     Left Ear: Tympanic membrane, ear canal and external ear normal.     Mouth/Throat:     Mouth: Mucous membranes are moist.     Pharynx: Oropharynx is clear.  Eyes:     Extraocular Movements: Extraocular movements intact.     Pupils: Pupils are equal, round, and reactive to light.  Cardiovascular:     Rate and Rhythm: Normal rate and regular rhythm.     Pulses: Normal pulses.     Heart sounds: Normal heart sounds.  Pulmonary:     Effort: Pulmonary effort is normal.     Breath  sounds: Normal breath sounds.  Abdominal:     General: Bowel sounds are normal. There is no distension.     Palpations: There is no mass.     Tenderness: There is no abdominal tenderness.     Hernia: No hernia is present.  Musculoskeletal:     Right lower leg: No edema.     Left lower leg: No edema.  Lymphadenopathy:     Cervical: No cervical adenopathy.  Skin:    General: Skin is warm.  Neurological:     General: No focal deficit present.     Mental Status: She is alert.     Deep Tendon Reflexes:     Reflex Scores:      Bicep reflexes are 2+ on the right side and 2+ on the left side.      Patellar reflexes are 2+ on the right side and 2+ on the left side.    Comments: Bilateral upper and lower extremity strength 5/5   Psychiatric:        Mood and Affect: Mood normal.        Behavior: Behavior normal.        Thought Content: Thought content normal.        Judgment: Judgment normal.      No results found for any visits on 08/19/23.    The 10-year ASCVD risk score (Arnett DK, et al., 2019) is: 32.4%    Assessment & Plan:   Problem List Items Addressed This Visit       Cardiovascular and Mediastinum   HTN (hypertension)   Currently maintained on lifestyle. BP elevated on initial check and recheck. Will have her follow up in 3 months for a recheck. If elevated at that point consider antihypertensives      Relevant Orders   CBC   Comprehensive metabolic panel with GFR   TSH     Other   Anxiety and depression   History of the same in remission currently patient denies HI/SI/AVH.  Stable      History of left breast cancer   Preventative health care - Primary   Discussed age-appropriate immunizations and screening exams.  Did review patient's personal, surgical, social, family histories.  Patient is up-to-date on all age-appropriate vaccinations she would like.  She will get her second shingles vaccine at the local pharmacy.  Patient is up-to-date on CRC screening.  Patient declines osteoporosis screening.  Patient has underwent double mastectomy does not qualify for mammogram.  Patient has aged out of Pap smears.  We did discuss advanced directives.      Relevant Orders   CBC   Comprehensive metabolic panel with GFR   TSH   Vitamin B12 deficiency   Hx of the same. Pending B12 level today       Relevant Orders   Vitamin B12   Pure hypercholesterolemia   History of the same pending lipid panel today      Relevant Orders   Lipid panel   Tobacco use   History of the same.  Patient declined LDCT.  Will check urine microscopy to rule out microscopic hematuria.  Discussed about smoking cessation.  Patient has tried patches in the past with some success but not interested currently       Relevant Orders   Urine Microscopic    Return in about 3 months (around 11/19/2023) for BP recheck.    Adina Crandall, NP

## 2023-08-19 NOTE — Assessment & Plan Note (Signed)
 Discussed age-appropriate immunizations and screening exams.  Did review patient's personal, surgical, social, family histories.  Patient is up-to-date on all age-appropriate vaccinations she would like.  She will get her second shingles vaccine at the local pharmacy.  Patient is up-to-date on CRC screening.  Patient declines osteoporosis screening.  Patient has underwent double mastectomy does not qualify for mammogram.  Patient has aged out of Pap smears.  We did discuss advanced directives.

## 2023-08-19 NOTE — Assessment & Plan Note (Signed)
 Currently maintained on lifestyle. BP elevated on initial check and recheck. Will have her follow up in 3 months for a recheck. If elevated at that point consider antihypertensives

## 2023-08-19 NOTE — Assessment & Plan Note (Signed)
 Hx of the same. Pending B12 level today

## 2023-08-20 LAB — COMPREHENSIVE METABOLIC PANEL WITH GFR
AG Ratio: 1.5 (calc) (ref 1.0–2.5)
ALT: 8 U/L (ref 6–29)
AST: 16 U/L (ref 10–35)
Albumin: 4.2 g/dL (ref 3.6–5.1)
Alkaline phosphatase (APISO): 93 U/L (ref 37–153)
BUN/Creatinine Ratio: 18 (calc) (ref 6–22)
BUN: 26 mg/dL — ABNORMAL HIGH (ref 7–25)
CO2: 26 mmol/L (ref 20–32)
Calcium: 9.4 mg/dL (ref 8.6–10.4)
Chloride: 103 mmol/L (ref 98–110)
Creat: 1.41 mg/dL — ABNORMAL HIGH (ref 0.60–1.00)
Globulin: 2.8 g/dL (ref 1.9–3.7)
Glucose, Bld: 205 mg/dL — ABNORMAL HIGH (ref 65–99)
Potassium: 4.4 mmol/L (ref 3.5–5.3)
Sodium: 138 mmol/L (ref 135–146)
Total Bilirubin: 0.3 mg/dL (ref 0.2–1.2)
Total Protein: 7 g/dL (ref 6.1–8.1)
eGFR: 38 mL/min/1.73m2 — ABNORMAL LOW (ref >=60)

## 2023-08-20 LAB — CBC
HCT: 43.8 % (ref 35.0–45.0)
Hemoglobin: 13.8 g/dL (ref 11.7–15.5)
MCH: 29.4 pg (ref 27.0–33.0)
MCHC: 31.5 g/dL — ABNORMAL LOW (ref 32.0–36.0)
MCV: 93.2 fL (ref 80.0–100.0)
MPV: 10 fL (ref 7.5–12.5)
Platelets: 141 Thousand/uL (ref 140–400)
RBC: 4.7 Million/uL (ref 3.80–5.10)
RDW: 13.3 % (ref 11.0–15.0)
WBC: 9.1 Thousand/uL (ref 3.8–10.8)

## 2023-08-20 LAB — VITAMIN B12: Vitamin B-12: 411 pg/mL (ref 200–1100)

## 2023-08-20 LAB — LIPID PANEL
Cholesterol: 162 mg/dL (ref <200)
HDL: 62 mg/dL (ref >=50)
LDL Cholesterol (Calc): 84 mg/dL
Non-HDL Cholesterol (Calc): 100 mg/dL (ref <130)
Total CHOL/HDL Ratio: 2.6 (calc) (ref <5.0)
Triglycerides: 74 mg/dL (ref <150)

## 2023-08-20 LAB — URINALYSIS, MICROSCOPIC ONLY
Bacteria, UA: NONE SEEN /HPF
Hyaline Cast: NONE SEEN /LPF
RBC / HPF: NONE SEEN /HPF (ref 0–2)
Squamous Epithelial / HPF: NONE SEEN /HPF (ref <=5)
WBC, UA: NONE SEEN /HPF (ref 0–5)

## 2023-08-20 LAB — TSH: TSH: 2.35 m[IU]/L (ref 0.40–4.50)

## 2023-08-24 ENCOUNTER — Ambulatory Visit: Payer: Self-pay | Admitting: Nurse Practitioner

## 2023-08-24 DIAGNOSIS — N289 Disorder of kidney and ureter, unspecified: Secondary | ICD-10-CM

## 2023-09-20 ENCOUNTER — Other Ambulatory Visit (INDEPENDENT_AMBULATORY_CARE_PROVIDER_SITE_OTHER)

## 2023-09-20 DIAGNOSIS — N289 Disorder of kidney and ureter, unspecified: Secondary | ICD-10-CM

## 2023-09-20 LAB — BASIC METABOLIC PANEL WITH GFR
BUN: 22 mg/dL (ref 6–23)
CO2: 27 meq/L (ref 19–32)
Calcium: 9.6 mg/dL (ref 8.4–10.5)
Chloride: 100 meq/L (ref 96–112)
Creatinine, Ser: 1.32 mg/dL — ABNORMAL HIGH (ref 0.40–1.20)
GFR: 38.63 mL/min — ABNORMAL LOW (ref >60.00)
Glucose, Bld: 94 mg/dL (ref 70–99)
Potassium: 5 meq/L (ref 3.5–5.1)
Sodium: 137 meq/L (ref 135–145)

## 2023-09-23 ENCOUNTER — Ambulatory Visit: Payer: Self-pay | Admitting: Nurse Practitioner

## 2023-11-19 ENCOUNTER — Encounter: Payer: Self-pay | Admitting: Nurse Practitioner

## 2023-11-19 ENCOUNTER — Ambulatory Visit: Admitting: Nurse Practitioner

## 2023-11-19 VITALS — BP 130/86 | HR 113 | Temp 97.5°F | Ht 62.5 in | Wt 101.8 lb

## 2023-11-19 DIAGNOSIS — I10 Essential (primary) hypertension: Secondary | ICD-10-CM | POA: Diagnosis not present

## 2023-11-19 DIAGNOSIS — Z720 Tobacco use: Secondary | ICD-10-CM

## 2023-11-19 MED ORDER — AMLODIPINE BESYLATE 2.5 MG PO TABS: 2.5000 mg | ORAL_TABLET | Freq: Every day | ORAL | 1 refills | Status: AC

## 2023-11-19 NOTE — Patient Instructions (Signed)
 Nice to see you today  I want to see you again in 3 months, sooner if you need me I have sent in the blood pressure medication. Take it once daily  We did update your flu vaccine today

## 2023-11-19 NOTE — Progress Notes (Signed)
 Established Patient Office Visit  Subjective   Patient ID: Veronica Hartman, female    DOB: [birth year]  Age: 78 y.o. MRN: [id number]  Chief Complaint  Patient presents with   Blood Pressure Check    HPI  Discussed the use of AI scribe software for clinical note transcription with the patient, who gave verbal consent to proceed.  History of Present Illness Veronica Hartman is a 78 year old female who presents for blood pressure management.  She has a history of elevated blood pressure, first noted during her last visit a few months ago. Her blood pressure was traditionally normal but was elevated during a mastectomy due to stress. She does not have the ability to check her blood pressure at home, but her neighbor does. She recalls being on lisinopril  in the past but has not taken it for a while.  No current chest pain or shortness of breath. She has experienced a recent episode of coughing, which she attributes to seasonal changes.  She smokes about a pack of cigarettes a day.     Review of Systems  Constitutional:  Negative for chills and fever.  Respiratory:  Negative for shortness of breath.   Cardiovascular:  Negative for chest pain.  Neurological:  Negative for dizziness and headaches.      Objective:     BP 130/86   Pulse (!) 113   Temp (!) 97.5 F (36.4 C) (Oral)   Ht 5' 2.5 (1.588 m)   Wt 101 lb 12.8 oz (46.2 kg)   SpO2 98%   BMI 18.32 kg/m  BP Readings from Last 3 Encounters:  11/19/23 130/86  08/19/23 (!) 138/100  06/28/23 138/78   Wt Readings from Last 3 Encounters:  11/19/23 101 lb 12.8 oz (46.2 kg)  08/19/23 104 lb 3.2 oz (47.3 kg)  06/28/23 110 lb (49.9 kg)   SpO2 Readings from Last 3 Encounters:  11/19/23 98%  08/19/23 95%  08/10/22 99%      Physical Exam Vitals and nursing note reviewed.  Constitutional:      Appearance: Normal appearance.  Cardiovascular:     Rate and Rhythm: Normal rate and regular rhythm.     Heart sounds:  Normal heart sounds.  Pulmonary:     Effort: Pulmonary effort is normal.     Breath sounds: Normal breath sounds.  Neurological:     Mental Status: She is alert.      No results found for any visits on 11/19/23.    The 10-year ASCVD risk score (Arnett DK, et al., 2019) is: 38.3%    Assessment & Plan:   Problem List Items Addressed This Visit       Cardiovascular and Mediastinum   HTN (hypertension) - Primary   Relevant Medications   amLODipine (NORVASC) 2.5 MG tablet     Other   Tobacco use   Assessment and Plan Assessment & Plan Hypertension Blood pressure elevated, previously avoided lisinopril  due to kidney concerns. - Prescribe amlodipine 2.5 mg once daily. - Monitor blood pressure at home with neighbor's assistance, aiming for a couple of readings per month. - Instruct to report any dizziness, lightheadedness, or leg swelling. - Follow up in 3 months.  Chronic kidney disease, unspecified stage Decreased kidney function, possibly related to age and blood pressure.  Tobacco use disorder (nicotine dependence, cigarettes) Continues smoking one pack per day, affecting blood pressure and kidney function. Explored Chantix, but concerns about kidney function and side effects. - Encourage gradual reduction in  smoking, starting by reducing daily cigarette count. - Discuss potential use of Chantix after reviewing kidney function compatibility.  General Health Maintenance Discussed importance of vaccinations due to smoking history. - Administer flu shot during the visit. - Encourage completion of the second shingles vaccine at the pharmacy.  Return in about 3 months (around 02/19/2024) for BP recheck.    Adina Crandall, NP

## 2024-02-21 ENCOUNTER — Ambulatory Visit (INDEPENDENT_AMBULATORY_CARE_PROVIDER_SITE_OTHER): Admitting: Nurse Practitioner

## 2024-02-21 VITALS — BP 128/80 | HR 71 | Temp 98.2°F | Ht 62.5 in | Wt 103.0 lb

## 2024-02-21 DIAGNOSIS — I10 Essential (primary) hypertension: Secondary | ICD-10-CM

## 2024-02-21 DIAGNOSIS — J22 Unspecified acute lower respiratory infection: Secondary | ICD-10-CM | POA: Diagnosis not present

## 2024-02-21 MED ORDER — AMOXICILLIN-POT CLAVULANATE 875-125 MG PO TABS: 1.0000 | ORAL_TABLET | Freq: Two times a day (BID) | ORAL | 0 refills | Status: AC

## 2024-02-21 MED ORDER — PREDNISONE 20 MG PO TABS: 40.0000 mg | ORAL_TABLET | Freq: Every day | ORAL | 0 refills | Status: AC

## 2024-02-21 NOTE — Progress Notes (Signed)
 "  Established Patient Office Visit  Subjective   Patient ID: Veronica Hartman, female    DOB: 1945/09/04  Age: 79 y.o. MRN: [id number]  Chief Complaint  Patient presents with   Blood Pressure Check    Pt complains of doing well with BP. Last 3 readings : 133/79, 128/80, 129/80     HPI  Discussed the use of AI scribe software for clinical note transcription with the patient, who gave verbal consent to proceed.  History of Present Illness Veronica Hartman is a 79 year old female with hypertension who presents for follow-up on blood pressure management and respiratory symptoms.  She is on amlodipine  2.5 mg for hypertension, which she tolerates well without side effects such as lightheadedness or dizziness. Her blood pressure readings have been satisfactory.  She has experienced respiratory symptoms for about a week, starting around Christmas. Symptoms include a sensation of head swelling, nasal congestion, and a mild fever, which was not measured. She used Mucinex cold and flu, which alleviated her symptoms. She continues to have a cough and occasional shortness of breath, particularly after feeding her horses in the morning. No current fever and she has not observed the color of her sputum.  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 Since stopping work with plants, she has not had pneumonia.  She has a history of smoking, which may exacerbate her respiratory symptoms. She is allergic to sulfa antibiotics.  Her cough is more pronounced since the recent illness, but she does not typically cough from smoking.    Review of Systems  Constitutional:  Negative for chills and fever.  HENT:  Positive for sinus pain. Negative for sore throat.   Respiratory:  Positive for cough, sputum production and shortness of breath.   Cardiovascular:  Negative for chest pain.  Neurological:  Negative for  dizziness and headaches.      Objective:     BP 128/80   Pulse 71   Temp 98.2 F (36.8 C) (Oral)   Ht 5' 2.5 (1.588 m)   Wt 103 lb (46.7 kg)   SpO2 99%   BMI 18.54 kg/m  BP Readings from Last 3 Encounters:  02/21/24 128/80  11/19/23 130/86  08/19/23 (!) 138/100   Wt Readings from Last 3 Encounters:  02/21/24 103 lb (46.7 kg)  11/19/23 101 lb 12.8 oz (46.2 kg)  08/19/23 104 lb 3.2 oz (47.3 kg)   SpO2 Readings from Last 3 Encounters:  02/21/24 99%  11/19/23 98%  08/19/23 95%      Physical Exam Vitals and nursing note reviewed.  Constitutional:      Appearance: Normal appearance.  HENT:     Right Ear: Tympanic membrane, ear canal and external ear normal.     Left Ear: Tympanic membrane, ear canal and external ear normal.     Nose:     Right Sinus: Maxillary sinus tenderness present. No frontal sinus tenderness.     Left Sinus: Maxillary sinus tenderness present. No frontal sinus tenderness.     Mouth/Throat:     Mouth: Mucous membranes are moist.     Pharynx: Oropharynx is clear.  Cardiovascular:     Rate and Rhythm: Normal rate and regular rhythm.     Heart sounds: Normal heart sounds.  Pulmonary:     Effort: Pulmonary effort is normal.     Breath sounds: Rhonchi present.  Lymphadenopathy:     Cervical: Cervical  adenopathy present.  Neurological:     Mental Status: She is alert.      No results found for any visits on 02/21/24.    The 10-year ASCVD risk score (Arnett DK, et al., 2019) is: 40.6%    Assessment & Plan:   Problem List Items Addressed This Visit       Cardiovascular and Mediastinum   HTN (hypertension)   Other Visit Diagnoses       Lower respiratory infection    -  Primary   Relevant Medications   amoxicillin -clavulanate (AUGMENTIN ) 875-125 MG tablet   predniSONE  (DELTASONE ) 20 MG tablet      Assessment and Plan Assessment & Plan Acute bronchitis with wheezing Recent respiratory illness with persistent cough and  wheezing, likely exacerbated by smoking and possible allergies. - Prescribed Augmentin  875-125 mg twice daily for 7 days for lung infection. - Prescribed prednisone  for 5 days, 2 pills in the morning with food, for wheezing and inflammation. - Sent prescriptions to pharmacy in [place].  Primary hypertension Blood pressure well-controlled on current medication regimen. - Continue Amlodipine  2.5 mg oral once daily.  Tobacco use disorder Continued tobacco use contributing to respiratory issues. - Discussed impact of smoking on respiratory health and encouraged smoking cessation.   Return in about 6 months (around 08/20/2024) for CPE and Labs.    Adina Crandall, NP  "

## 2024-02-21 NOTE — Patient Instructions (Signed)
 Nice to see you today I have sent 2 medications to the pharmacy  Follow up with me in 6 months for your physical, sooner if you need me

## 2024-06-29 ENCOUNTER — Ambulatory Visit

## 2024-07-07 ENCOUNTER — Ambulatory Visit

## 2024-08-22 ENCOUNTER — Encounter: Admitting: Nurse Practitioner
